# Patient Record
Sex: Male | Born: 1975 | Race: Black or African American | Hispanic: No | Smoking: Former smoker
Health system: Southern US, Community
[De-identification: ages and names within clinical notes are randomized; demographics above are authoritative.]

## PROBLEM LIST (undated history)

## (undated) DIAGNOSIS — E119 Type 2 diabetes mellitus without complications: Secondary | ICD-10-CM

## (undated) HISTORY — DX: Type 2 diabetes mellitus without complications: E11.9

---

## 1999-08-04 ENCOUNTER — Emergency Department (HOSPITAL_COMMUNITY): Admission: EM | Admit: 1999-08-04 | Discharge: 1999-08-04 | Payer: Self-pay | Admitting: Emergency Medicine

## 1999-08-05 ENCOUNTER — Encounter: Payer: Self-pay | Admitting: Emergency Medicine

## 2008-06-22 ENCOUNTER — Emergency Department (HOSPITAL_BASED_OUTPATIENT_CLINIC_OR_DEPARTMENT_OTHER): Admission: EM | Admit: 2008-06-22 | Discharge: 2008-06-22 | Payer: Self-pay | Admitting: Emergency Medicine

## 2008-06-22 ENCOUNTER — Ambulatory Visit: Payer: Self-pay | Admitting: Diagnostic Radiology

## 2009-06-21 ENCOUNTER — Emergency Department (HOSPITAL_BASED_OUTPATIENT_CLINIC_OR_DEPARTMENT_OTHER): Admission: EM | Admit: 2009-06-21 | Discharge: 2009-06-21 | Payer: Self-pay | Admitting: Emergency Medicine

## 2011-08-16 ENCOUNTER — Encounter (HOSPITAL_BASED_OUTPATIENT_CLINIC_OR_DEPARTMENT_OTHER): Payer: Self-pay | Admitting: *Deleted

## 2011-08-16 ENCOUNTER — Emergency Department (INDEPENDENT_AMBULATORY_CARE_PROVIDER_SITE_OTHER): Payer: Self-pay

## 2011-08-16 ENCOUNTER — Emergency Department (HOSPITAL_BASED_OUTPATIENT_CLINIC_OR_DEPARTMENT_OTHER)
Admission: EM | Admit: 2011-08-16 | Discharge: 2011-08-16 | Disposition: A | Discharge status: Home / Self Care | Payer: Self-pay | Attending: Emergency Medicine | Admitting: Emergency Medicine

## 2011-08-16 DIAGNOSIS — S298XXA Other specified injuries of thorax, initial encounter: Secondary | ICD-10-CM

## 2011-08-16 DIAGNOSIS — IMO0002 Reserved for concepts with insufficient information to code with codable children: Secondary | ICD-10-CM

## 2011-08-16 DIAGNOSIS — W219XXA Striking against or struck by unspecified sports equipment, initial encounter: Secondary | ICD-10-CM | POA: Insufficient documentation

## 2011-08-16 DIAGNOSIS — S20219A Contusion of unspecified front wall of thorax, initial encounter: Secondary | ICD-10-CM | POA: Insufficient documentation

## 2011-08-16 DIAGNOSIS — Y9364 Activity, baseball: Secondary | ICD-10-CM | POA: Insufficient documentation

## 2011-08-16 MED ORDER — IBUPROFEN 800 MG PO TABS: 800.0000 mg | ORAL_TABLET | Freq: Three times a day (TID) | ORAL | Status: AC

## 2011-08-16 MED ORDER — IBUPROFEN 800 MG PO TABS
800.0000 mg | ORAL_TABLET | Freq: Once | ORAL | Status: AC
  Administered 2011-08-16: 800 mg via ORAL
  Filled 2011-08-16: qty 1

## 2011-08-16 MED ORDER — OXYCODONE HCL 5 MG PO TABS: 5.0000 mg | ORAL_TABLET | ORAL | Status: AC | PRN
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
## 2011-08-16 NOTE — ED Notes (Signed)
Pt amb to room 7 with quick steady gait in nad. 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 Cont with rib pain, ice pack in place. Lungs are cta a+p x 2,  Pt denies sob, pain with inspiration.

## 2011-08-16 NOTE — Discharge Instructions (Signed)
Chest Contusion You have been checked for injuries to your chest. Your caregiver has not found injuries serious enough to require hospitalization. It is common to have bruises and sore muscles after an injury. These tend to feel worse the first 24 hours. You may gradually develop more stiffness and soreness over the next several hours to several days. This usually feels worse the first morning following your injury. After a few days, you will usually begin to improve. The amount of improvement depends on the amount of damage. Following the accident, if the pain in any area continues to increase or you develop new areas of pain, you should see your primary caregiver or return to the Emergency Department for re-evaluation. HOME CARE INSTRUCTIONS   Put ice on sore areas every 2 hours for 20 minutes while awake for the next 2 days.   Drink extra fluids. Do not drink alcohol.   Activity as tolerated. Lifting may make pain worse.   Only take over-the-counter or prescription medicines for pain, discomfort, or fever as directed by your caregiver. Do not use aspirin. This may increase bruising or increase bleeding.  SEEK IMMEDIATE MEDICAL CARE IF:   There is a worsening of any of the problems that brought you in for care.   Shortness of breath, dizziness or fainting develop.   You have chest pain, difficulty breathing, or develop pain going down the left arm or up into jaw.   You feel sick to your stomach (nausea), vomiting or sweats.   You have increasing belly (abdominal) discomfort.   There is blood in your urine, stool, or if you vomit blood.   There is pain in either shoulder in an area where a shoulder strap would be.   You have feelings of lightheadedness, or if you should have a fainting episode.   You have numbness, tingling, weakness, or problems with the use of your arms or legs.   Severe headaches not relieved with medications develop.   You have a change in bowel or bladder  control.   There is increasing pain in any areas of the body.  If you feel your symptoms are worsening, and you are not able to see your primary caregiver, return to the Emergency Department immediately. MAKE SURE YOU:   Understand these instructions.   Will watch your condition.   Will get help right away if you are not doing well or get worse.  Document Released: 01/15/2001 Document Revised: 04/11/2011 Document Reviewed: 12/09/2007 [facility] Patient Information 2012 [place], [place].

## 2011-08-21 ENCOUNTER — Encounter: Payer: Self-pay | Admitting: Internal Medicine

## 2011-08-22 ENCOUNTER — Encounter: Payer: Self-pay | Admitting: Internal Medicine

## 2011-08-23 ENCOUNTER — Ambulatory Visit: Payer: Self-pay | Admitting: Internal Medicine

## 2011-09-03 ENCOUNTER — Emergency Department (HOSPITAL_BASED_OUTPATIENT_CLINIC_OR_DEPARTMENT_OTHER)
Admission: EM | Admit: 2011-09-03 | Discharge: 2011-09-04 | Disposition: A | Discharge status: Home / Self Care | Payer: BC Managed Care – PPO | Attending: Emergency Medicine | Admitting: Emergency Medicine

## 2011-09-03 ENCOUNTER — Encounter (HOSPITAL_BASED_OUTPATIENT_CLINIC_OR_DEPARTMENT_OTHER): Payer: Self-pay | Admitting: Student

## 2011-09-03 DIAGNOSIS — L0291 Cutaneous abscess, unspecified: Secondary | ICD-10-CM

## 2011-09-03 DIAGNOSIS — IMO0002 Reserved for concepts with insufficient information to code with codable children: Secondary | ICD-10-CM | POA: Insufficient documentation

## 2011-09-03 NOTE — ED Notes (Signed)
Pt in with c/o abscess to right upper bicep

## 2011-09-03 NOTE — ED Notes (Signed)
Pt reieved new tatoo 4/20, on 4/27 developed raised area medial to tattoo,  Large boil noted to area, warm, tender to touch, surrounding skin intact, + pp,

## 2011-09-04 MED ORDER — MORPHINE SULFATE 4 MG/ML IJ SOLN
4.0000 mg | Freq: Once | INTRAMUSCULAR | Status: AC
  Administered 2011-09-04: 4 mg via INTRAMUSCULAR
  Filled 2011-09-04: qty 1

## 2011-09-04 MED ORDER — SULFAMETHOXAZOLE-TRIMETHOPRIM 800-160 MG PO TABS: 1.0000 | ORAL_TABLET | Freq: Two times a day (BID) | ORAL | Status: AC

## 2011-09-04 MED ORDER — OXYCODONE HCL 5 MG PO TABS: 5.0000 mg | ORAL_TABLET | Freq: Four times a day (QID) | ORAL | Status: AC | PRN

## 2011-09-04 MED ORDER — LIDOCAINE-EPINEPHRINE 2 %-1:100000 IJ SOLN
INTRAMUSCULAR | Status: AC
  Filled 2011-09-04: qty 1

## 2011-09-04 NOTE — ED Provider Notes (Signed)
History     CSN: [id number]  Arrival date & time 09/03/11  2116   First MD Initiated Contact with Patient 09/04/11 0049      Chief Complaint  Patient presents with  . Abscess     Patient is a 36 y.o. male presenting with abscess. The history is provided by the patient.  Abscess  This is a new problem. The current episode started less than one week ago. The onset was gradual. The problem occurs continuously. The problem has been gradually worsening. Affected Location: right upper extremity. The problem is moderate. Pertinent negatives include no fever, no vomiting and no cough.  pt reports he had tattoo placed to right UE on 4/20.  Several days later he noticed redness/pain near the tattoo He has no other significant complaints  PMH - none  History reviewed. No pertinent past surgical history.  History reviewed. No pertinent family history.  History  Substance Use Topics  . Smoking status: Never Smoker   . Smokeless tobacco: Not on file  . Alcohol Use: No      Review of Systems  Constitutional: Negative for fever.  HENT: Negative for facial swelling.   Respiratory: Negative for cough.   Gastrointestinal: Negative for vomiting.  Musculoskeletal: Negative for joint swelling.  Skin: Positive for color change and wound.  Neurological: Negative for weakness.  Psychiatric/Behavioral: Negative for agitation.    Allergies  Tylenol  Home Medications   Current Outpatient Rx  Name Route Sig Dispense Refill  . ASPIRIN-ACETAMINOPHEN-CAFFEINE 250-250-65 MG PO TABS Oral Take 2 tablets by mouth every 6 (six) hours as needed. For headache    . VITAMINS A & D EX OINT Topical Apply 1 application topically 2 (two) times daily. For tattoo    . OXYCODONE HCL 5 MG PO TABS Oral Take 1 tablet (5 mg total) by mouth every 6 (six) hours as needed for pain. 15 tablet 0  . SULFAMETHOXAZOLE-TRIMETHOPRIM 800-160 MG PO TABS Oral Take 1 tablet by mouth every 12 (twelve) hours. 10 tablet 0     BP 105/60  Pulse 58  Temp(Src) 98.9 F (37.2 C) (Oral)  Resp 20  Ht 6' (1.829 m)  Wt 170 lb (77.111 kg)  BMI 23.06 kg/m2  SpO2 100%  Physical Exam CONSTITUTIONAL: Well developed/well nourished HEAD AND FACE: Normocephalic/atraumatic EYES: EOMI/PERRL ENMT: Mucous membranes moist NECK: supple no meningeal signs SPINE:entire spine nontender CV: S1/S2 noted, no murmurs/rubs/gallops noted LUNGS: Lungs are clear to auscultation bilaterally, no apparent distress ABDOMEN: soft, nontender, no rebound or guarding GU:no cva tenderness NEURO: Pt is awake/alert, moves all extremitiesx4 EXTREMITIES: pulses normal, full ROM.  Abscess noted to right upper extremity just medial to the tattoo.  Localized erythema noted but no crepitance noted.  No streaking noted SKIN: warm, color normal PSYCH: no abnormalities of mood noted  ED Course  Procedures   INCISION AND DRAINAGE Performed by: Joya Gaskins Consent: Verbal consent obtained. Risks and benefits: risks, benefits and alternatives were discussed Type: abscess  Body area: right arm  Anesthesia: local infiltration  Local anesthetic: lidocaine 1% with epinephrine  Anesthetic total: 3 ml  Complexity: complex Blunt dissection to break up loculations  Drainage: purulent  Drainage amount: minimal    Patient tolerance: Patient tolerated the procedure well with no immediate complications.       1. Abscess       MDM  Nursing notes reviewed and considered in documentation  The patient appears reasonably screened and/or stabilized for discharge and I doubt any other  medical condition or other [facility] requiring further screening, evaluation, or treatment in the ED at this time prior to discharge.         Joya Gaskins, MD 09/04/11 [phone]

## 2011-09-04 NOTE — Discharge Instructions (Signed)

## 2013-03-08 ENCOUNTER — Emergency Department (HOSPITAL_BASED_OUTPATIENT_CLINIC_OR_DEPARTMENT_OTHER)
Admission: EM | Admit: 2013-03-08 | Discharge: 2013-03-08 | Disposition: A | Discharge status: Home / Self Care | Payer: BC Managed Care – PPO | Attending: Emergency Medicine | Admitting: Emergency Medicine

## 2013-03-08 ENCOUNTER — Encounter (HOSPITAL_BASED_OUTPATIENT_CLINIC_OR_DEPARTMENT_OTHER): Payer: Self-pay | Admitting: Emergency Medicine

## 2013-03-08 DIAGNOSIS — IMO0002 Reserved for concepts with insufficient information to code with codable children: Secondary | ICD-10-CM | POA: Insufficient documentation

## 2013-03-08 DIAGNOSIS — L0291 Cutaneous abscess, unspecified: Secondary | ICD-10-CM

## 2013-03-08 MED ORDER — OXYCODONE HCL 5 MG PO TABS: 5.0000 mg | ORAL_TABLET | ORAL | Status: DC | PRN

## 2013-03-08 MED ORDER — OXYCODONE HCL 5 MG PO TABS
5.0000 mg | ORAL_TABLET | Freq: Once | ORAL | Status: AC
  Administered 2013-03-08: 5 mg via ORAL
  Filled 2013-03-08: qty 1

## 2013-03-08 MED ORDER — SULFAMETHOXAZOLE-TMP DS 800-160 MG PO TABS: 2.0000 | ORAL_TABLET | Freq: Two times a day (BID) | ORAL | Status: DC

## 2013-03-08 NOTE — ED Provider Notes (Signed)
CSN: [id number]     Arrival date & time 03/08/13  1926 History   First MD Initiated Contact with Patient 03/08/13 2033     Chief Complaint  Patient presents with  . Abscess   (Consider location/radiation/quality/duration/timing/severity/associated sxs/prior Treatment) The history is provided by the patient.   Pt has had left axillary abscess x 2 days.  Denies fevers, chills, body aches, N/V.  Wife attempted to pop it but patient couldn't stand the pain. Has taken no medications at home.  History reviewed. No pertinent past medical history. History reviewed. No pertinent past surgical history. History reviewed. No pertinent family history. History  Substance Use Topics  . Smoking status: Never Smoker   . Smokeless tobacco: Not on file  . Alcohol Use: No    Review of Systems  Constitutional: Negative for fever and chills.  Gastrointestinal: Negative for nausea and vomiting.  Skin: Positive for color change.    Allergies  Tylenol  Home Medications   Current Outpatient Rx  Name  Route  Sig  Dispense  Refill  . aspirin-acetaminophen-caffeine (EXCEDRIN MIGRAINE) 250-250-65 MG per tablet   Oral   Take 2 tablets by mouth every 6 (six) hours as needed. For headache         . Vitamins A & D (VITAMIN A & D) ointment   Topical   Apply 1 application topically 2 (two) times daily. For tattoo          BP 122/71  Pulse 61  Temp(Src) 98.6 F (37 C) (Oral)  Resp 15  Ht 6' (1.829 m)  Wt 180 lb (81.647 kg)  BMI 24.41 kg/m2  SpO2 100% Physical Exam  Nursing note and vitals reviewed. Constitutional: He appears well-developed and well-nourished. No distress.  HENT:  Head: Normocephalic and atraumatic.  Neck: Neck supple.  Pulmonary/Chest: Effort normal.  Neurological: He is alert.  Skin: He is not diaphoretic.  Tense,/fluctuant superficial abscess with overlying erythema, tender to palpation    ED Course  Procedures (including critical care time) Labs Review Labs  Reviewed - No data to display Imaging Review No results found.  EKG Interpretation   None     INCISION AND DRAINAGE Performed by: Trixie Dredge B Consent: Verbal consent obtained. Risks and benefits: risks, benefits and alternatives were discussed Type: abscess  Body area: left axilla  Anesthesia: local infiltration  Incision was made with a scalpel.  Local anesthetic: lidocaine 2% no epinephrine  Anesthetic total: 4 ml  Complexity: complex Blunt dissection to break up loculations  Drainage: purulent  Drainage amount: moderate  Packing material: none  Patient tolerance: Patient tolerated the procedure well with no immediate complications.     MDM   1. Abscess and cellulitis     Pt with abscess with overlying localized cellulitis afebrile, nontoxic, I&D performed in ED with moderate amount purulence expressed with remaining area of induration.  D/C home with oxycodone and bactrim. Discussed  findings, treatment, home care, and follow up  with patient.  Pt given return precautions.  Pt verbalizes understanding and agrees with plan.         Trixie Dredge, PA-C 03/08/13 2329

## 2013-03-08 NOTE — ED Notes (Signed)
Pt c/o abscess to left axillary x 2 days 

## 2013-03-09 NOTE — ED Provider Notes (Signed)
  Medical screening examination/treatment/procedure(s) were performed by non-physician practitioner and as supervising physician I was immediately available for consultation/collaboration.  EKG Interpretation   None          Chaun Uemura, MD 03/09/13 0003 

## 2013-03-16 ENCOUNTER — Emergency Department (HOSPITAL_BASED_OUTPATIENT_CLINIC_OR_DEPARTMENT_OTHER): Payer: BC Managed Care – PPO

## 2013-03-16 ENCOUNTER — Emergency Department (HOSPITAL_BASED_OUTPATIENT_CLINIC_OR_DEPARTMENT_OTHER)
Admission: EM | Admit: 2013-03-16 | Discharge: 2013-03-17 | Disposition: A | Discharge status: Home / Self Care | Payer: BC Managed Care – PPO | Attending: Emergency Medicine | Admitting: Emergency Medicine

## 2013-03-16 ENCOUNTER — Encounter (HOSPITAL_BASED_OUTPATIENT_CLINIC_OR_DEPARTMENT_OTHER): Payer: Self-pay | Admitting: Emergency Medicine

## 2013-03-16 DIAGNOSIS — Y92410 Unspecified street and highway as the place of occurrence of the external cause: Secondary | ICD-10-CM | POA: Insufficient documentation

## 2013-03-16 DIAGNOSIS — Z79899 Other long term (current) drug therapy: Secondary | ICD-10-CM | POA: Insufficient documentation

## 2013-03-16 DIAGNOSIS — S8990XA Unspecified injury of unspecified lower leg, initial encounter: Secondary | ICD-10-CM | POA: Insufficient documentation

## 2013-03-16 DIAGNOSIS — Y9389 Activity, other specified: Secondary | ICD-10-CM | POA: Insufficient documentation

## 2013-03-16 DIAGNOSIS — S62233A Other displaced fracture of base of first metacarpal bone, unspecified hand, initial encounter for closed fracture: Secondary | ICD-10-CM | POA: Insufficient documentation

## 2013-03-16 DIAGNOSIS — S0993XA Unspecified injury of face, initial encounter: Secondary | ICD-10-CM | POA: Insufficient documentation

## 2013-03-16 DIAGNOSIS — S62309A Unspecified fracture of unspecified metacarpal bone, initial encounter for closed fracture: Secondary | ICD-10-CM

## 2013-03-16 MED ORDER — NAPROXEN 500 MG PO TABS: 500.0000 mg | ORAL_TABLET | Freq: Two times a day (BID) | ORAL | Status: DC

## 2013-03-16 MED ORDER — MORPHINE SULFATE 4 MG/ML IJ SOLN
4.0000 mg | Freq: Once | INTRAMUSCULAR | Status: AC
  Administered 2013-03-16: 4 mg via INTRAVENOUS
  Filled 2013-03-16 (×2): qty 1

## 2013-03-16 MED ORDER — OXYCODONE HCL 5 MG PO TABS: 5.0000 mg | ORAL_TABLET | ORAL | Status: DC | PRN

## 2013-03-16 MED ORDER — SODIUM CHLORIDE 0.9 % IV SOLN
INTRAVENOUS | Status: DC
  Administered 2013-03-16: 22:00:00 via INTRAVENOUS

## 2013-03-16 MED ORDER — IOHEXOL 300 MG/ML  SOLN
100.0000 mL | Freq: Once | INTRAMUSCULAR | Status: AC | PRN
  Administered 2013-03-16: 100 mL via INTRAVENOUS
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
## 2013-03-16 NOTE — ED Notes (Signed)
First Thumb spica splint from Rt arm removed due to Pt stating being to tight. Thumb spica splint was completely redone over and Pt states it no longer feels tight.

## 2014-09-15 ENCOUNTER — Other Ambulatory Visit: Payer: Self-pay | Admitting: Adult Health

## 2014-09-15 ENCOUNTER — Ambulatory Visit (INDEPENDENT_AMBULATORY_CARE_PROVIDER_SITE_OTHER): Payer: Self-pay

## 2014-09-15 DIAGNOSIS — M79642 Pain in left hand: Secondary | ICD-10-CM

## 2014-09-15 DIAGNOSIS — R52 Pain, unspecified: Secondary | ICD-10-CM

## 2015-03-08 ENCOUNTER — Encounter: Payer: Self-pay | Admitting: *Deleted

## 2015-03-08 ENCOUNTER — Emergency Department (INDEPENDENT_AMBULATORY_CARE_PROVIDER_SITE_OTHER)
Admission: EM | Admit: 2015-03-08 | Discharge: 2015-03-08 | Disposition: A | Discharge status: Home / Self Care | Payer: PRIVATE HEALTH INSURANCE | Source: Home / Self Care | Attending: Family Medicine | Admitting: Family Medicine

## 2015-03-08 DIAGNOSIS — S7012XA Contusion of left thigh, initial encounter: Secondary | ICD-10-CM

## 2015-03-08 MED ORDER — MELOXICAM 15 MG PO TABS: 15.0000 mg | ORAL_TABLET | Freq: Every day | ORAL | Status: DC

## 2015-03-08 MED ORDER — CYCLOBENZAPRINE HCL 5 MG PO TABS: ORAL_TABLET | ORAL | Status: DC
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
## 2015-03-08 NOTE — ED Provider Notes (Signed)
CSN: [id number]     Arrival date & time 03/08/15  1615 History   First MD Initiated Contact with Patient 03/08/15 1641     Chief Complaint  Patient presents with  . Leg Pain      HPI Comments: While playing basketball one week ago, patient was "kneed" on his left anterior/lateral thigh by another player.  His initial swelling has decreased, but he still has pain in his left anterior thigh when walking/running.  Patient is a 39 y.o. male presenting with leg pain. The history is provided by the patient.  Leg Pain Location:  Leg Time since incident:  2 weeks Injury: yes   Mechanism of injury comment:  Contusion while playing basketball Leg location:  L upper leg Pain details:    Quality:  Aching   Radiates to:  Does not radiate   Severity:  Mild   Onset quality:  Gradual   Duration:  1 week   Timing:  Constant   Progression:  Improving Chronicity:  New Prior injury to area:  No Worsened by:  Bearing weight and flexion Associated symptoms: decreased ROM, stiffness and swelling   Associated symptoms: no back pain, no fever, no muscle weakness, no numbness and no tingling     History reviewed. No pertinent past medical history. History reviewed. No pertinent past surgical history. Family History  Problem Relation Age of Onset  . Cancer Mother     lung CA   Social History  Substance Use Topics  . Smoking status: Former Smoker    Quit date: 03/07/2010  . Smokeless tobacco: None  . Alcohol Use: No    Review of Systems  Constitutional: Negative for fever.  Musculoskeletal: Positive for stiffness. Negative for back pain.  All other systems reviewed and are negative.   Allergies  Tylenol  Home Medications   Prior to Admission medications   Medication Sig Start Date End Date Taking? Authorizing Provider  cyclobenzaprine (FLEXERIL) 5 MG tablet Take one tab by mouth at bedtime for muscle spasm 03/08/15   Lattie HawStephen A Beese, MD  meloxicam (MOBIC) 15 MG tablet Take 1 tablet (15  mg total) by mouth daily. Take with food each morning 03/08/15   Lattie HawStephen A Beese, MD   Meds Ordered and Administered this Visit  Medications - No data to display  BP 111/68 mmHg  Pulse 52  Resp 14  Ht 6' (1.829 m)  Wt 168 lb (76.204 kg)  BMI 22.78 kg/m2  SpO2 100% No data found.   Physical Exam  Constitutional: He is oriented to person, place, and time. He appears well-developed and well-nourished. No distress.  HENT:  Head: Normocephalic.  Eyes: Pupils are equal, round, and reactive to light.  Neck: Normal range of motion.  Pulmonary/Chest: Breath sounds normal.  Abdominal: There is no tenderness.  Musculoskeletal:       Left upper leg: He exhibits tenderness. He exhibits no bony tenderness, no swelling, no edema, no deformity and no laceration.       Legs: Left anterior thigh has tenderness to palpation extending to the greater trochanter.  Knee and hip have relatively good range of motion.  No swelling noted.  No erythema or warmth.  Distal neurovascular function is intact.   Neurological: He is alert and oriented to person, place, and time.  Skin: Skin is warm and dry.  Nursing note and vitals reviewed.   ED Course  Procedures  none   MDM   1. Contusion of left thigh, initial encounter  Begin Mobic  QAM, and Flexeril  HS Ace wrap applied. Apply ice pack for 20 to 30 minutes, 2 to 3 times daily  Continue until pain decreases.  Then may switch to heating pad.  Wear ace wrap daytime.  Begin stretching and range of motion exercises as tolerated. Followup with Dr. Rodney Langton or Dr. Clementeen [facility] ([facility]) if not improving about two weeks.     Lattie Haw, MD 03/09/15 1137

## 2015-03-08 NOTE — Discharge Instructions (Signed)
Apply ice pack for 20 to 30 minutes, 2 to 3 times daily  Continue until pain decreases.  Then may switch to heating pad.  Wear ace wrap daytime.  Begin stretching and range of motion exercises as tolerated.   Quadriceps Contusion With Rehab A contusion is a bruise to the skin and underlying tissues. The muscles on the top of the thigh (quadriceps) are a common location for a contusion. The contusion is caused by trauma that results in bleeding that enters the muscles, tendons, and skin.  SYMPTOMS   Pain, inflammation, and tenderness over the quadriceps muscle.  Characteristic "black and blue" discoloration.  Feeling of firmness when pressure is exerted at the injury site.  Limited function of the quadriceps muscles (straightening the knee or bending the hip).  Knee stiffness or pain when trying to bend the knee. CAUSES  Quadriceps contusions are caused by direct trauma to the thigh the ruptures small blood vessels, allowing blood to enter the soft tissues. RISK INCREASES WITH:  Contact sports (football, rugby, or soccer).  Failure to wear protective equipment.  Bleeding disorder or use of blood thinners (anticoagulants), or nonsteroidal anti-inflammatory medications. PREVENTION   Wear properly fitted and padded protective equipment.  If you have a bleeding disorder, then avoid any activities that may result in a traumatic injury.  Limit use of anticoagulants and nonsteroidal anti-inflammatory medications. PROGNOSIS  If treated properly, then quadriceps contusions usually heal within 2 weeks.  RELATED COMPLICATIONS   Complications from excessive bleeding, especially compartment syndrome.  Bone formation (calcification) that occurs during healing and may result in pain or limited function.  Infection (uncommon).  Knee stiffness or loss of motion.  Prolonged healing time, if improperly treated or re-injured. TREATMENT  Treatment initially involves the use of ice and  medication to help reduce pain and inflammation. Do not take nonsteroidal anti-inflammatory medications within 3 days of injury. The use of strengthening and stretching exercises may help reduce pain with activity. These exercises may be performed at home or with referral to a therapist. Compression bandages may also help reduce pain and inflammation. Rarely, your caregiver may use a needle to remove the collection of blood under the skin (hematoma).  MEDICATION If pain medication is necessary, then nonsteroidal anti-inflammatory medications, such as aspirin and ibuprofen, or other minor pain relievers, such as acetaminophen, are often recommended.  HEAT AND COLD  Cold treatment (icing) relieves pain and reduces inflammation. Cold treatment should be applied for 10 to 15 minutes every 2 to 3 hours for inflammation and pain and immediately after any activity that aggravates your symptoms. Use ice packs or massage the area with a piece of ice (ice massage).  Heat treatment may be used prior to performing the stretching and strengthening activities prescribed by your caregiver, physical therapist, or athletic trainer. Use a heat pack or soak the injury in warm water. SEEK MEDICAL CARE IF:  Treatment seems to offer no benefit, or the condition worsens.  Any medications produce adverse side effects. EXERCISES  RANGE OF MOTION (ROM) AND STRETCHING EXERCISES - Quadriceps Contusion These exercises may help you when beginning to rehabilitate your injury. Your symptoms may resolve with or without further involvement from your physician, physical therapist or athletic trainer. While completing these exercises, remember:   Restoring tissue flexibility helps normal motion to return to the joints. This allows healthier, less painful movement and activity.  An effective stretch should be held for at least 30 seconds.  A stretch should never be painful. You  should only feel a gentle lengthening or release in  the stretched tissue. RANGE OF MOTION - Knee Flexion, Active  Lie on your back with both knees straight. (If this causes back discomfort, bend your opposite knee, placing your foot flat on the floor.)  Slowly slide your heel back toward your buttocks until you feel a gentle stretch in the front of your knee or thigh.  Hold for ____10______ seconds. Slowly slide your heel back to the starting position. Repeat ____10______ times. Complete this exercise ___2_______ times per day.  STRETCH - Quadriceps, Prone   Lie on your stomach on a firm surface, such as a bed or padded floor.  Bend your right / left knee and grasp your ankle. If you are unable to reach, your ankle or pant leg, use a belt around your foot to lengthen your reach.  Gently pull your heel toward your buttocks. Your knee should not slide out to the side. You should feel a stretch in the front of your thigh and/or knee.  Hold this position for ____10______ seconds. Repeat _____10_____ times. Complete this stretch ______2____ times per day.  STRENGTHENING EXERCISES - Quadriceps Contusion These exercises may help you when beginning to rehabilitate your injury. They may resolve your symptoms with or without further involvement from your physician, physical therapist or athletic trainer. While completing these exercises, remember:   Muscles can gain both the endurance and the strength needed for everyday activities through controlled exercises.  Complete these exercises as instructed by your physician, physical therapist or athletic trainer. Progress the resistance and repetitions only as guided. STRENGTH - Quadriceps, Isometrics  Lie on your back with your right / left leg extended and your opposite knee bent.  Gradually tense the muscles in the front of your right / left thigh. You should see either your knee cap slide up toward your hip or increased dimpling just above the knee. This motion will push the back of the knee down  toward the floor/mat/bed on which you are lying.  Hold the muscle as tight as you can without increasing your pain for ___10_______ seconds.  Relax the muscles slowly and completely in between each repetition. Repeat _____10_____ times. Complete this exercise _____2_____ times per day.  STRENGTH - Quadriceps, Short Arcs   Lie on your back. Place a ___6_______ inch towel roll under your knee so that the knee slightly bends.  Raise only your lower leg by tightening the muscles in the front of your thigh. Do not allow your thigh to rise.  Hold this position for ___20_______ seconds. Repeat ______5____ times. Complete this exercise _____2_____ times per day.  OPTIONAL ANKLE WEIGHTS: Begin with _________2___________, but DO NOT exceed _________5___________. Increase in 1 lb/0.5 kg increments.  STRENGTH - Quadriceps, Straight Leg Raises  Quality counts! Watch for signs that the quadriceps muscle is working to insure you are strengthening the correct muscles and not "cheating" by substituting with healthier muscles.  Lay on your back with your right / left leg extended and your opposite knee bent.  Tense the muscles in the front of your right / left thigh. You should see either your knee cap slide up or increased dimpling just above the knee. Your thigh may even quiver.  Tighten these muscles even more and raise your leg 4 to 6 inches off the floor. Hold for ______10____ seconds.  Keeping these muscles tense, lower your leg.  Relax the muscles slowly and completely in between each repetition. Repeat ______5____ times. Complete this exercise  _____1_____ times per day.  STRENGTH - Quadriceps, Wall Slides  Follow guidelines for form closely. Increased knee pain often results from poorly placed feet or knees.  Lean against a smooth wall or door and walk your feet out 18-24 inches. Place your feet hip-width apart.  Slowly slide down the wall or door until your knees bend ____45______ degrees.*  Keep your knees over your heels, not your toes, and in line with your hips, not falling to either side.  Hold for _____10_____ seconds. Stand up to rest for ____10______ seconds in between each repetition. Repeat _____5_____ times. Complete this exercise ____1______ times per day. * Your physician, physical therapist or athletic trainer will alter this angle based on your symptoms and progress.   This information is not intended to replace advice given to you by your health care provider. Make sure you discuss any questions you have with your health care provider.   Document Released: 04/22/2005 Document Revised: 09/06/2014 Document Reviewed: 08/04/2008 Elsevier Interactive Patient Education [employer]

## 2015-08-30 ENCOUNTER — Encounter: Payer: Self-pay | Admitting: *Deleted

## 2015-08-30 ENCOUNTER — Emergency Department (INDEPENDENT_AMBULATORY_CARE_PROVIDER_SITE_OTHER)
Admission: EM | Admit: 2015-08-30 | Discharge: 2015-08-30 | Disposition: A | Discharge status: Home / Self Care | Payer: Self-pay | Source: Home / Self Care | Attending: Family Medicine | Admitting: Family Medicine

## 2015-08-30 DIAGNOSIS — S39012D Strain of muscle, fascia and tendon of lower back, subsequent encounter: Secondary | ICD-10-CM

## 2015-08-30 MED ORDER — KETOROLAC TROMETHAMINE 60 MG/2ML IM SOLN
60.0000 mg | Freq: Once | INTRAMUSCULAR | Status: AC
  Administered 2015-08-30: 60 mg via INTRAMUSCULAR

## 2015-08-30 MED ORDER — PREDNISONE 20 MG PO TABS: ORAL_TABLET | ORAL | Status: DC

## 2015-08-30 MED ORDER — TRAMADOL HCL 50 MG PO TABS: 50.0000 mg | ORAL_TABLET | Freq: Four times a day (QID) | ORAL | Status: DC | PRN

## 2015-08-30 MED ORDER — METHYLPREDNISOLONE SODIUM SUCC 40 MG IJ SOLR
80.0000 mg | Freq: Once | INTRAMUSCULAR | Status: AC
  Administered 2015-08-30: 80 mg via INTRAMUSCULAR

## 2015-08-30 NOTE — ED Provider Notes (Signed)
CSN: [id number]     Arrival date & time 08/30/15  [id number] History   First MD Initiated Contact with Patient 08/30/15 1045     Chief Complaint  Patient presents with  . Back Pain   (Consider location/radiation/quality/duration/timing/severity/associated sxs/prior Treatment) HPI  The pt is a 40yo [facility] with a work-related back injury that initially started on 08/18/15.  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  He saw employee health for the initial injury to his knee but back pain was only mild at that time.  Pt was cleared on 08/28/15 to return to light duty but notes yesterday and today his back pain worsened significantly.  Pain is aching and sharp, worse with any movement. His back hurt so bad last night he couldn't take his prescribed flexeril. He has not taken any medication, including his prescribed meloxicam today. Denies new injuries including heavy lifting or falls.  Denies numbness or tingling in arms or legs. Denies change in bowel or bladder habits. Denies hx of back surgeries.    History reviewed. No pertinent past medical history. History reviewed. No pertinent past surgical history. Family History  Problem Relation Age of Onset  . Cancer Mother     lung CA  . Multiple sclerosis Mother   . Hypertension Father    Social History  Substance Use Topics  . Smoking status: Former Smoker    Quit date: 03/07/2010  . Smokeless tobacco: None  . Alcohol Use: No    Review of Systems  Constitutional: Negative for fever and chills.  Genitourinary: Negative for dysuria, frequency, hematuria and flank pain.  Musculoskeletal: Positive for myalgias, back pain and gait problem. Negative for arthralgias.       Bilateral lower back pain  Skin: Negative for color change, rash and wound.  Neurological: Negative for weakness and numbness.    Allergies   Tylenol  Home Medications   Prior to Admission medications   Medication Sig Start Date End Date Taking? Authorizing Provider  cyclobenzaprine (FLEXERIL) 5 MG tablet Take one tab by mouth at bedtime for muscle spasm 03/08/15   Lattie HawStephen A Beese, MD  meloxicam (MOBIC) 15 MG tablet Take 1 tablet (15 mg total) by mouth daily. Take with food each morning 03/08/15   Lattie HawStephen A Beese, MD  predniSONE (DELTASONE) 20 MG tablet 3 tabs po daily x 3 days, then 2 tabs x 3 days, then 1.5 tabs x 3 days, then 1 tab x 3 days, then 0.5 tabs x 3 days 08/30/15   Junius FinnerErin O'Malley, PA-C  traMADol (ULTRAM) 50 MG tablet Take 1 tablet (50 mg total) by mouth every 6 (six) hours as needed. 08/30/15   Junius FinnerErin O'Malley, PA-C   Meds Ordered and Administered this Visit   Medications  ketorolac (TORADOL) injection 60 mg (60 mg Intramuscular Given 08/30/15 1105)  methylPREDNISolone sodium succinate (SOLU-MEDROL) 40 mg/mL injection 80 mg (80 mg Intramuscular Given 08/30/15 1105)    BP 106/73 mmHg  Pulse 78  Temp(Src) 98.4 F (36.9 C) (Oral)  Resp 16  Ht 6' (1.829 m)  Wt 178 lb (80.74 kg)  BMI 24.14 kg/m2  SpO2 99% No data found.   Physical Exam  Constitutional: He appears well-developed and well-nourished.  HENT:  Head: Normocephalic and atraumatic.  Eyes: Conjunctivae are normal. No scleral icterus.  Neck: Normal range of motion.  Cardiovascular: Normal  rate, regular rhythm and normal heart sounds.   Pulmonary/Chest: Effort normal and breath sounds normal. No respiratory distress. He has no wheezes. He has no rales.  Abdominal: Soft. He exhibits no distension. There is no tenderness.  Musculoskeletal: Normal range of motion. He exhibits tenderness. He exhibits no edema.  No midline spinal tenderness. Tenderness to Left and Right lower lumbar muscles. Negative straight leg raise. Full ROM upper and lower extremities bilaterally with 5/5 strength.  Pt does move with slow movements when changing positions from sitting to  standing, standing to sitting, and sitting to lying.   Neurological: He is alert.  Reflex Scores:      Patellar reflexes are 2+ on the right side and 2+ on the left side. Antalgic gait  Skin: Skin is warm and dry. No rash noted. No erythema.  Nursing note and vitals reviewed.   ED Course  Procedures (including critical care time)  Labs Review Labs Reviewed - No data to display  Imaging Review No results found.    MDM   1. Low back strain, subsequent encounter    Pt c/o lower back pain from a work-related injury on 08/18/15.  No red flag symptoms. No skin changes.  Pain likely due to back muscle strain.  Tx in UC: Toradol  IM and solumedrol  IM  Pt currently prescribed meloxicam and flexeril. He may continue to take that as prescribed. Will also prescribe predisone 2 week taper and tramadol.  Home care instructions with gentle stretching exercises provided. Pt may return to work with restricted duty today. No driving, limit standing, walking, and sitting. No lifting more than 5 pounds. Encouraged to return to employee health tomorrow to discuss physical therapy or other referrals as pt was just cleared by employee health for same reported injury.    Junius Finner, PA-C 08/30/15 1232

## 2015-08-30 NOTE — ED Notes (Signed)
Pt c/o low back pain x 08/18/15. He was seen in employer Health for a workers comp LT knee injury on 08/18/15 with Tristan SchroederKaty Bess,NP with minimal back pain at that time. He reports worsening back pain x 1 wk. He was seen for f/u with Beverlyn RouxBess, NP on 08/28/15 and was released back to work.

## 2015-08-30 NOTE — Discharge Instructions (Signed)
Tramadol is strong pain medication. While taking, do not drink alcohol, drive, or perform any other activities that requires focus while taking these medications.   You were given a shot of solumedrol a steroid) today to help with itching and swelling from a likely allergic reaction.  You have been prescribed 2 weeks of prednisone, an oral steroid.  You may start this medication tomorrow with breakfast and be sure to take as directed.  If you start to have any unwanted symptoms, please be sure to talk with employee health before stopping the medication as stopping to soon can cause unwanted side effects as well.

## 2015-08-31 ENCOUNTER — Ambulatory Visit (INDEPENDENT_AMBULATORY_CARE_PROVIDER_SITE_OTHER): Payer: Self-pay

## 2015-08-31 ENCOUNTER — Other Ambulatory Visit: Payer: Self-pay | Admitting: Emergency Medicine

## 2015-08-31 DIAGNOSIS — M545 Low back pain: Secondary | ICD-10-CM

## 2016-05-14 ENCOUNTER — Emergency Department (HOSPITAL_BASED_OUTPATIENT_CLINIC_OR_DEPARTMENT_OTHER)
Admission: EM | Admit: 2016-05-14 | Discharge: 2016-05-15 | Disposition: A | Discharge status: Home / Self Care | Payer: PRIVATE HEALTH INSURANCE | Attending: Emergency Medicine | Admitting: Emergency Medicine

## 2016-05-14 ENCOUNTER — Emergency Department (HOSPITAL_BASED_OUTPATIENT_CLINIC_OR_DEPARTMENT_OTHER): Payer: PRIVATE HEALTH INSURANCE

## 2016-05-14 ENCOUNTER — Encounter (HOSPITAL_BASED_OUTPATIENT_CLINIC_OR_DEPARTMENT_OTHER): Payer: Self-pay | Admitting: *Deleted

## 2016-05-14 DIAGNOSIS — R05 Cough: Secondary | ICD-10-CM | POA: Diagnosis not present

## 2016-05-14 DIAGNOSIS — M791 Myalgia: Secondary | ICD-10-CM | POA: Insufficient documentation

## 2016-05-14 DIAGNOSIS — R509 Fever, unspecified: Secondary | ICD-10-CM | POA: Diagnosis not present

## 2016-05-14 DIAGNOSIS — Z87891 Personal history of nicotine dependence: Secondary | ICD-10-CM | POA: Diagnosis not present

## 2016-05-14 DIAGNOSIS — J111 Influenza due to unidentified influenza virus with other respiratory manifestations: Secondary | ICD-10-CM

## 2016-05-14 DIAGNOSIS — R69 Illness, unspecified: Secondary | ICD-10-CM

## 2016-05-14 MED ORDER — IBUPROFEN 800 MG PO TABS
ORAL_TABLET | ORAL | Status: AC
  Filled 2016-05-14: qty 1

## 2016-05-14 MED ORDER — IBUPROFEN 800 MG PO TABS
800.0000 mg | ORAL_TABLET | Freq: Once | ORAL | Status: AC
  Administered 2016-05-14: 800 mg via ORAL

## 2016-05-14 NOTE — ED Notes (Signed)
ED Provider at bedside. 

## 2016-05-14 NOTE — ED Triage Notes (Signed)
Pt c/o URi symptoms with body aches and fever x 6 days

## 2016-05-14 NOTE — ED Provider Notes (Signed)
[facility]   CSN: [id number] Arrival date & time: 05/14/16  2159  By signing my name below, I, Linna DarnerRussell Turner, attest that this documentation has been prepared under the direction and in the presence of physician practitioner, Tilden FossaElizabeth Betzalel Umbarger, MD. Electronically Signed: Linna Darnerussell Turner, Scribe. 05/14/2016. 11:34 PM.  History   Chief Complaint Chief Complaint  Patient presents with  . URI    The history is provided by the patient. No language interpreter was used.     HPI Comments: Henry Ramsey is a 41 y.o. male who presents to the Emergency Department complaining of constant generalized myalgias for 2-3 days. He reports an associated subjective fever as well as a productive cough with dark mucus. He notes one episode of hemoptysis since onset. He has tried generic Dayquil/Nyquil with no improvement of his symptoms. No known sick contacts with similar symptoms. No known medical problems. No regular medications. He notes an allergy to Tylenol (rash). He denies congestion, sore throat, nausea, vomiting, diarrhea, or any other associated symptoms.  History reviewed. No pertinent past medical history.  There are no active problems to display for this patient.   History reviewed. No pertinent surgical history.     Home Medications    Prior to Admission medications   Medication Sig Start Date End Date Taking? Authorizing Provider  traMADol (ULTRAM) 50 MG tablet Take 1 tablet (50 mg total) by mouth every 6 (six) hours as needed. 08/30/15   Junius FinnerErin O'Malley, PA-C    Family History Family History  Problem Relation Age of Onset  . Cancer Mother     lung CA  . Multiple sclerosis Mother   . Hypertension Father     Social History Social History  Substance Use Topics  . Smoking status: Former Smoker    Quit date: 03/07/2010  . Smokeless tobacco: Not on file  . Alcohol use No     Allergies   Tylenol [acetaminophen]   Review of Systems Review of Systems    Constitutional: Positive for fever (subjective).  HENT: Negative for congestion and sore throat.   Respiratory: Positive for cough (productive; one episode of hemoptysis).   Gastrointestinal: Negative for diarrhea, nausea and vomiting.  Musculoskeletal: Positive for myalgias (generalized).  All other systems reviewed and are negative.    Physical Exam Updated Vital Signs BP 116/84   Pulse 61   Temp 99.1 F (37.3 C) (Oral)   Resp 18   Ht 6' (1.829 m)   Wt 170 lb (77.1 kg)   SpO2 98%   BMI 23.06 kg/m   Physical Exam  Constitutional: He is oriented to person, place, and time. He appears well-developed and well-nourished.  HENT:  Head: Normocephalic and atraumatic.  Cardiovascular: Normal rate and regular rhythm.   No murmur heard. Pulmonary/Chest: Effort normal and breath sounds normal. No respiratory distress.  Abdominal: Soft. There is no tenderness. There is no rebound and no guarding.  Musculoskeletal: He exhibits no edema or tenderness.  Neurological: He is alert and oriented to person, place, and time.  Skin: Skin is warm and dry.  Psychiatric: He has a normal mood and affect. His behavior is normal.  Nursing note and vitals reviewed.    ED Treatments / Results  Labs (all labs ordered are listed, but only abnormal results are displayed) Labs Reviewed - No data to display  EKG  EKG Interpretation None       Radiology Dg Chest 2 View  Result Date: 05/14/2016 CLINICAL DATA:  Fever  cough and body aches EXAM: CHEST  2 VIEW COMPARISON:  08/16/2011 FINDINGS: The heart size and mediastinal contours are within normal limits. Both lungs are clear. The visualized skeletal structures are unremarkable. IMPRESSION: No active cardiopulmonary disease. Electronically Signed   By: Jasmine Pang M.D.   On: 05/14/2016 23:54    Procedures Procedures (including critical care time)  DIAGNOSTIC STUDIES: Oxygen Saturation is 98% on RA, normal by my interpretation.     COORDINATION OF CARE: 11:38 PM Discussed treatment plan with pt at bedside and pt agreed to plan.  Medications Ordered in ED Medications  ibuprofen (ADVIL,MOTRIN) tablet 800 mg (800 mg Oral Given 05/14/16 2214)     Initial Impression / Assessment and Plan / ED Course  I have reviewed the triage vital signs and the nursing notes.  Pertinent labs & imaging results that were available during my care of the patient were reviewed by me and considered in my medical decision making (see chart for details).  Clinical Course     Patient here for evaluation of fever, cough, body aches. He is nontoxic appearing on examination with no respiratory distress. Presentation is not consistent with PE, pulmonary hemorrhage, serious bacterial infection. Discussed likely influenza, patient is out of the window for Tamiflu treatment. Discussed home care with Tylenol or ibuprofen for fever and myalgias. Discussed oral fluid hydration. Discussed outpatient follow-up and return precautions.  Final Clinical Impressions(s) / ED Diagnoses   Final diagnoses:  Influenza-like illness    New Prescriptions Discharge Medication List as of 05/15/2016 12:07 AM     I personally performed the services described in this documentation, which was scribed in my presence. The recorded information has been reviewed and is accurate.    Tilden Fossa, MD 05/15/16 [phone]

## 2016-06-24 ENCOUNTER — Encounter (HOSPITAL_BASED_OUTPATIENT_CLINIC_OR_DEPARTMENT_OTHER): Payer: Self-pay | Admitting: *Deleted

## 2016-06-24 ENCOUNTER — Emergency Department (HOSPITAL_BASED_OUTPATIENT_CLINIC_OR_DEPARTMENT_OTHER)
Admission: EM | Admit: 2016-06-24 | Discharge: 2016-06-24 | Disposition: A | Discharge status: Home / Self Care | Payer: PRIVATE HEALTH INSURANCE | Attending: Emergency Medicine | Admitting: Emergency Medicine

## 2016-06-24 DIAGNOSIS — M549 Dorsalgia, unspecified: Secondary | ICD-10-CM | POA: Diagnosis not present

## 2016-06-24 DIAGNOSIS — J029 Acute pharyngitis, unspecified: Secondary | ICD-10-CM

## 2016-06-24 DIAGNOSIS — Z87891 Personal history of nicotine dependence: Secondary | ICD-10-CM | POA: Diagnosis not present

## 2016-06-24 LAB — RAPID STREP SCREEN (MED CTR MEBANE ONLY): Streptococcus, Group A Screen (Direct): NEGATIVE

## 2016-06-24 MED ORDER — AMOXICILLIN 500 MG PO CAPS: 500.0000 mg | ORAL_CAPSULE | Freq: Three times a day (TID) | ORAL | 0 refills | Status: DC

## 2016-06-24 NOTE — Discharge Instructions (Signed)
Get help right away if: °Your neck becomes stiff. °You drool or are unable to swallow liquids. °You vomit or are unable to keep medicines or liquids down. °You have severe pain that does not go away with the use of recommended medicines. °You have trouble breathing (not caused by a stuffy nose). °

## 2016-06-24 NOTE — ED Triage Notes (Signed)
Sore throat x 2 days

## 2016-06-24 NOTE — ED Provider Notes (Signed)
[facility]   CSN: [id number] Arrival date & time: 06/24/16  1713  By signing my name below, I, Arianna Nassar, attest that this documentation has been prepared under the direction and in the presence of Arthor Captain,  PA-C.  Electronically Signed: Octavia Heir, ED Scribe. 06/24/16. 6:18 PM.    History   Chief Complaint Chief Complaint  Patient presents with  . Sore Throat   The history is provided by the patient. No language interpreter was used.   HPI Comments: Henry Ramsey is a 41 y.o. male who presents to the Emergency Department complaining of moderate, gradual worsening sore throat x 4 days. He states his sore is sore and scratchy. He reports associated fever (tmax 100) that has since resolved. Pt reports having the flu ~ 3 weeks ago. He says that his symptoms started to alleviate significantly but about 4 days ago his sore throat came back. He is able to swallow his own secretions. Pt denies ear pain, generalized myalgias, and trouble swallowing.   History reviewed. No pertinent past medical history.  There are no active problems to display for this patient.   History reviewed. No pertinent surgical history.     Home Medications    Prior to Admission medications   Not on File    Family History Family History  Problem Relation Age of Onset  . Cancer Mother     lung CA  . Multiple sclerosis Mother   . Hypertension Father     Social History Social History  Substance Use Topics  . Smoking status: Former Smoker    Quit date: 03/07/2010  . Smokeless tobacco: Never Used  . Alcohol use No     Allergies   Tylenol [acetaminophen]   Review of Systems Review of Systems  HENT: Positive for sore throat. Negative for ear pain and trouble swallowing.   Musculoskeletal: Positive for back pain. Negative for arthralgias.     Physical Exam Updated Vital Signs BP 116/68 (BP Location: Left Arm)   Pulse 93   Temp 97.9 F (36.6 C)  (Oral)   Resp 16   Ht 6' (1.829 m)   Wt 176 lb (79.8 kg)   SpO2 100%   BMI 23.87 kg/m   Physical Exam  Constitutional: He is oriented to person, place, and time. He appears well-developed and well-nourished.  HENT:  Head: Normocephalic.  Mouth/Throat: Posterior oropharyngeal erythema present.  Bilateral tonsillar erythema   Eyes: EOM are normal.  Neck: Normal range of motion.  Cardiovascular: Normal rate and regular rhythm.   Pulmonary/Chest: Effort normal and breath sounds normal.  Abdominal: He exhibits no distension.  Musculoskeletal: Normal range of motion.  Neurological: He is alert and oriented to person, place, and time.  Psychiatric: He has a normal mood and affect.  Nursing note and vitals reviewed.    ED Treatments / Results  DIAGNOSTIC STUDIES: Oxygen Saturation is 100% on RA, normal by my interpretation.  COORDINATION OF CARE:  6:14 PM Discussed treatment plan with pt at bedside and pt agreed to plan.  Labs (all labs ordered are listed, but only abnormal results are displayed) Labs Reviewed  RAPID STREP SCREEN (NOT AT [facility])  CULTURE, GROUP A STREP [facility])    EKG  EKG Interpretation None       Radiology No results found.  Procedures Procedures (including critical care time)  Medications Ordered in ED Medications - No data to display   Initial Impression / Assessment and Plan / ED Course  I have reviewed the triage vital signs and the nursing notes.  Pertinent labs & imaging results that were available during my care of the patient were reviewed by me and considered in my medical decision making (see chart for details).     =  Final Clinical Impressions(s) / ED Diagnoses   Final diagnoses:  None   Patient with recent viral infection and now secondarily presents with body aches, chills and pharyngitis. Given this history, I have concern for secondary bacterial infection. Patient will be discharged with amoxicillin. Despite negative rapid  strep test. I discussed supportive care, OTC medications agrees to seek immediate medical care. Patient safe for discharge at this time.  I personally performed the services described in this documentation, which was scribed in my presence. The recorded information has been reviewed and is accurate.    New Prescriptions New Prescriptions   No medications on file     Arthor Captainbigail Joshue Badal, PA-C 06/25/16 0057    Loren Raceravid Yelverton, MD 06/25/16 [phone]

## 2016-06-27 LAB — CULTURE, GROUP A STREP (THRC)

## 2016-10-17 ENCOUNTER — Emergency Department (HOSPITAL_BASED_OUTPATIENT_CLINIC_OR_DEPARTMENT_OTHER)
Admission: EM | Admit: 2016-10-17 | Discharge: 2016-10-17 | Disposition: A | Discharge status: Home / Self Care | Payer: PRIVATE HEALTH INSURANCE | Attending: Emergency Medicine | Admitting: Emergency Medicine

## 2016-10-17 ENCOUNTER — Emergency Department (HOSPITAL_BASED_OUTPATIENT_CLINIC_OR_DEPARTMENT_OTHER): Payer: PRIVATE HEALTH INSURANCE

## 2016-10-17 ENCOUNTER — Encounter (HOSPITAL_BASED_OUTPATIENT_CLINIC_OR_DEPARTMENT_OTHER): Payer: Self-pay

## 2016-10-17 DIAGNOSIS — Z87891 Personal history of nicotine dependence: Secondary | ICD-10-CM | POA: Diagnosis not present

## 2016-10-17 DIAGNOSIS — M25511 Pain in right shoulder: Secondary | ICD-10-CM | POA: Insufficient documentation

## 2016-10-17 DIAGNOSIS — M25512 Pain in left shoulder: Secondary | ICD-10-CM | POA: Diagnosis not present

## 2016-10-17 DIAGNOSIS — Z791 Long term (current) use of non-steroidal anti-inflammatories (NSAID): Secondary | ICD-10-CM | POA: Insufficient documentation

## 2016-10-17 DIAGNOSIS — Z7982 Long term (current) use of aspirin: Secondary | ICD-10-CM | POA: Insufficient documentation

## 2016-10-17 MED ORDER — NAPROXEN 500 MG PO TABS: 500.0000 mg | ORAL_TABLET | Freq: Two times a day (BID) | ORAL | 0 refills | Status: DC

## 2016-10-17 MED ORDER — TRAMADOL HCL 50 MG PO TABS: 50.0000 mg | ORAL_TABLET | Freq: Four times a day (QID) | ORAL | 0 refills | Status: DC | PRN

## 2016-10-17 NOTE — ED Triage Notes (Addendum)
Pt reports bilateral inner shoulder pain, thinks his rotator cuff is torn, onset Monday, reports repetitive lifting at work. Pain radiates into neck. Exacerbated by movement. Bayer taken PTA.

## 2016-10-17 NOTE — ED Provider Notes (Signed)
[facility]   CSN: [id number] Arrival date & time: 10/17/16  2016  By signing my name below, I, Modena JanskyAlbert Thayil, attest that this documentation has been prepared under the direction and in the presence of Geoffery Lyonselo, Gerold Sar, MD. Electronically Signed: Modena JanskyAlbert Thayil, Scribe. 10/17/2016. 10:19 PM.  History   Chief Complaint Chief Complaint  Patient presents with  . Shoulder Pain   The history is provided by the patient. No language interpreter was used.  Shoulder Pain   This is a new problem. The current episode started more than 2 days ago. The problem occurs constantly. The problem has been gradually worsening. The pain is present in the left shoulder and right shoulder. The quality of the pain is described as constant. The pain is moderate. Pertinent negatives include no numbness and no tingling. The symptoms are aggravated by activity. He has tried nothing for the symptoms. There has been no history of extremity trauma.   HPI Comments: Henry Ramsey is a 41 y.o. male who presents to the Emergency Department complaining of constant moderate bilateral anterior shoulder pain that started 3 days ago. He states he has frequent heavy lifting with his job. No known specific injury. His gradually worsening pain is exacerbated by BUE movement and radiates to his neck. Denies any hx of shoulder pain, numbness/tingling, weakness, or other complaints at this time.  History reviewed. No pertinent past medical history.  There are no active problems to display for this patient.   History reviewed. No pertinent surgical history.     Home Medications    Prior to Admission medications   Medication Sig Start Date End Date Taking? Authorizing Provider  aspirin-acetaminophen-caffeine (EXCEDRIN MIGRAINE) [id number] MG tablet Take by mouth every 6 (six) hours as needed for headache.   Yes [provider]  Aspirin-Caffeine (BAYER BACK & BODY PO) Take by mouth.   Yes [provider]  amoxicillin (AMOXIL) 500 MG capsule Take 1 capsule (500 mg total) by mouth 3 (three) times daily. 06/24/16   Harris, Cammy CopaAbigail, PA-C  naproxen (NAPROSYN) 500 MG tablet Take 1 tablet (500 mg total) by mouth 2 (two) times daily. 10/17/16   Geoffery Lyonselo, Karena Kinker, MD  traMADol (ULTRAM) 50 MG tablet Take 1 tablet (50 mg total) by mouth every 6 (six) hours as needed. 10/17/16   Geoffery Lyonselo, Dawnisha Marquina, MD    Family History Family History  Problem Relation Age of Onset  . Cancer Mother        lung CA  . Multiple sclerosis Mother   . Hypertension Father     Social History Social History  Substance Use Topics  . Smoking status: Former Smoker    Quit date: 03/07/2010  . Smokeless tobacco: Never Used  . Alcohol use No     Allergies   Tylenol [acetaminophen]   Review of Systems Review of Systems  Constitutional: Negative for fever.  Musculoskeletal: Positive for myalgias and neck pain.  Neurological: Negative for tingling and numbness.  All other systems reviewed and are negative.    Physical Exam Updated Vital Signs BP 123/79 (BP Location: Right Arm)   Pulse 60   Temp 97.9 F (36.6 C) (Oral)   Resp 18   Ht 6' (1.829 m)   Wt 175 lb (79.4 kg)   SpO2 97%   BMI 23.73 kg/m   Physical Exam  Constitutional: He appears well-developed and well-nourished. No distress.  HENT:  Head: Normocephalic and atraumatic.  Eyes: Conjunctivae are normal.  Neck: Neck supple.  Cardiovascular: Normal rate.   Pulmonary/Chest: Effort normal.  Abdominal: Soft.  Musculoskeletal: Normal range of motion.  TTP to the anterior deltoid of both shoulders. Pain with arm extension. No crepitus. Ulnar and radial pulses are easily palpable. Motor and sensation are intact to both hands.   Neurological: He is alert.  Skin: Skin is warm and dry.  Psychiatric: He has a normal mood and affect.  Nursing note and vitals reviewed.    ED Treatments / Results  DIAGNOSTIC STUDIES: Oxygen Saturation is 97% on RA,  normal by my interpretation.    COORDINATION OF CARE: 10:21 PM- Pt advised of plan for treatment and pt agrees.  Labs (all labs ordered are listed, but only abnormal results are displayed) Labs Reviewed - No data to display  EKG  EKG Interpretation None       Radiology Dg Shoulder Right  Result Date: 10/17/2016 CLINICAL DATA:  Acute onset of bilateral shoulder pain, after repetitive lifting at work. Initial encounter. EXAM: RIGHT SHOULDER - 2+ VIEW COMPARISON:  Chest radiograph performed 05/14/2016 FINDINGS: There is no evidence of fracture or dislocation. The right humeral head is seated within the glenoid fossa. The acromioclavicular joint is unremarkable in appearance. No significant soft tissue abnormalities are seen. The visualized portions of the right lung are clear. IMPRESSION: No evidence of fracture or dislocation. Electronically Signed   By: Roanna Raider M.D.   On: 10/17/2016 21:29   Dg Shoulder Left  Result Date: 10/17/2016 CLINICAL DATA:  Acute onset of bilateral shoulder pain after repetitive lifting. Initial encounter. EXAM: LEFT SHOULDER - 2+ VIEW COMPARISON:  None. FINDINGS: There is no evidence of fracture or dislocation. The left humeral head is seated within the glenoid fossa. Degenerative change is noted at the left acromioclavicular joint. No significant soft tissue abnormalities are seen. The visualized portions of the left lung are clear. IMPRESSION: 1. No evidence of fracture or dislocation. 2. Degenerative change at the left acromioclavicular joint. Electronically Signed   By: Roanna Raider M.D.   On: 10/17/2016 21:28    Procedures Procedures (including critical care time)  Medications Ordered in ED Medications - No data to display   Initial Impression / Assessment and Plan / ED Course  I have reviewed the triage vital signs and the nursing notes.  Pertinent labs & imaging results that were available during my care of the patient were reviewed by me  and considered in my medical decision making (see chart for details).  Patient with bilateral shoulder pain in the absence of any specific injury or trauma. He does perform heavy lifting and repetitive motion at work and I suspect this is the cause. He will be treated with rest, anti-inflammatories, and is to follow-up as needed if not improving.  Final Clinical Impressions(s) / ED Diagnoses   Final diagnoses:  Acute pain of both shoulders    New Prescriptions Discharge Medication List as of 10/17/2016 10:25 PM    START taking these medications   Details  naproxen (NAPROSYN) 500 MG tablet Take 1 tablet (500 mg total) by mouth 2 (two) times daily., Starting Thu 10/17/2016, Print    traMADol (ULTRAM) 50 MG tablet Take 1 tablet (50 mg total) by mouth every 6 (six) hours as needed., Starting Thu 10/17/2016, Print       I personally performed the services described in this documentation, which was scribed in my presence. The recorded information has been reviewed and is accurate.        Geoffery Lyons, MD  10/17/16 2342  

## 2016-10-17 NOTE — Discharge Instructions (Signed)
Naproxen as prescribed.  Tramadol as prescribed as needed for pain not relieved with naproxen.  Rest.  Follow-up with your primary Dr. if you're not improving in the next 1-2 weeks.

## 2017-06-03 ENCOUNTER — Encounter (HOSPITAL_BASED_OUTPATIENT_CLINIC_OR_DEPARTMENT_OTHER): Payer: Self-pay | Admitting: *Deleted

## 2017-06-03 ENCOUNTER — Emergency Department (HOSPITAL_BASED_OUTPATIENT_CLINIC_OR_DEPARTMENT_OTHER)
Admission: EM | Admit: 2017-06-03 | Discharge: 2017-06-03 | Disposition: A | Discharge status: Home / Self Care | Payer: PRIVATE HEALTH INSURANCE | Attending: Emergency Medicine | Admitting: Emergency Medicine

## 2017-06-03 ENCOUNTER — Other Ambulatory Visit: Payer: Self-pay

## 2017-06-03 DIAGNOSIS — Z79899 Other long term (current) drug therapy: Secondary | ICD-10-CM | POA: Insufficient documentation

## 2017-06-03 DIAGNOSIS — Z87891 Personal history of nicotine dependence: Secondary | ICD-10-CM | POA: Insufficient documentation

## 2017-06-03 DIAGNOSIS — L0231 Cutaneous abscess of buttock: Secondary | ICD-10-CM | POA: Insufficient documentation

## 2017-06-03 DIAGNOSIS — L0291 Cutaneous abscess, unspecified: Secondary | ICD-10-CM

## 2017-06-03 MED ORDER — OXYCODONE HCL 5 MG PO TABS
5.0000 mg | ORAL_TABLET | Freq: Once | ORAL | Status: AC
Start: 2017-06-03 — End: 2017-06-03
  Administered 2017-06-03: 5 mg via ORAL
  Filled 2017-06-03: qty 1

## 2017-06-03 MED ORDER — CEPHALEXIN 500 MG PO CAPS: 500.0000 mg | ORAL_CAPSULE | Freq: Four times a day (QID) | ORAL | 0 refills | Status: DC

## 2017-06-03 MED ORDER — OXYCODONE HCL 5 MG PO TABS: 5.0000 mg | ORAL_TABLET | Freq: Four times a day (QID) | ORAL | 0 refills | Status: DC | PRN

## 2017-06-03 MED ORDER — LIDOCAINE HCL (PF) 1 % IJ SOLN
10.0000 mL | Freq: Once | INTRAMUSCULAR | Status: AC
  Administered 2017-06-03: 10 mL
  Filled 2017-06-03: qty 10

## 2017-06-03 NOTE — ED Provider Notes (Signed)
[facility] EMERGENCY DEPARTMENT Provider Note   CSN: [id number] Arrival date & time: 06/03/17  1412     History   Chief Complaint Chief Complaint  Patient presents with  . Abscess    HPI Henry Ramsey is a 42 y.o. male.  Patient reports onset of abscess at the upper aspect of the gluteal cleft yesterday. Patient complaining of increasing pain, especially with sitting.    Abscess  Location:  Pelvis Pelvic abscess location:  Gluteal cleft Abscess quality: induration and painful   Red streaking: no   Duration:  2 days Progression:  Worsening Pain details:    Quality:  Throbbing   Severity:  Severe   Timing:  Constant   Progression:  Worsening Chronicity:  New Associated symptoms: no fever     History reviewed. No pertinent past medical history.  There are no active problems to display for this patient.   History reviewed. No pertinent surgical history.     Home Medications    Prior to Admission medications   Medication Sig Start Date End Date Taking? Authorizing Provider  amoxicillin (AMOXIL) 500 MG capsule Take 1 capsule (500 mg total) by mouth 3 (three) times daily. 06/24/16   Arthor CaptainHarris, Abigail, PA-C  aspirin-acetaminophen-caffeine (EXCEDRIN MIGRAINE) [id number] MG tablet Take by mouth every 6 (six) hours as needed for headache.    [provider]  Aspirin-Caffeine (BAYER BACK & BODY PO) Take by mouth.    [provider]  naproxen (NAPROSYN) 500 MG tablet Take 1 tablet (500 mg total) by mouth 2 (two) times daily. 10/17/16   Geoffery Lyonselo, Douglas, MD  traMADol (ULTRAM) 50 MG tablet Take 1 tablet (50 mg total) by mouth every 6 (six) hours as needed. 10/17/16   Geoffery Lyonselo, Douglas, MD    Family History Family History  Problem Relation Age of Onset  . Cancer Mother        lung CA  . Multiple sclerosis Mother   . Hypertension Father     Social History Social History   Tobacco Use  . Smoking status: Former Smoker    Last attempt to quit:  03/07/2010    Years since quitting: 7.2  . Smokeless tobacco: Never Used  Substance Use Topics  . Alcohol use: No  . Drug use: No     Allergies   Tylenol [acetaminophen]   Review of Systems Review of Systems  Constitutional: Negative for fever.  All other systems reviewed and are negative.    Physical Exam Updated Vital Signs BP 113/74   Pulse (!) 55   Temp 98.6 F (37 C) (Oral)   Resp 18   Ht 5\' 11"  (1.803 m)   Wt 79.4 kg (175 lb)   SpO2 100%   BMI 24.41 kg/m   Physical Exam  Constitutional: He is oriented to person, place, and time. He appears well-developed and well-nourished.  HENT:  Head: Normocephalic.  Eyes: Conjunctivae are normal.  Neck: Neck supple.  Cardiovascular: Normal rate and regular rhythm.  Pulmonary/Chest: Effort normal and breath sounds normal.  Abdominal: Soft. Bowel sounds are normal.  Musculoskeletal: Normal range of motion.  Lymphadenopathy:    He has no cervical adenopathy.  Neurological: He is alert and oriented to person, place, and time.  Skin: Skin is warm and dry.  1 x .75 cm area of induration at the midline of upper gluteal cleft.  Psychiatric: He has a normal mood and affect.  Nursing note and vitals reviewed.    ED Treatments / Results  Labs (all labs ordered are listed, but only abnormal results are displayed) Labs Reviewed - No data to display  EKG  EKG Interpretation None       Radiology No results found.  Procedures .Marland KitchenIncision and Drainage Date/Time: 06/03/2017 6:23 PM Performed by: Felicie Morn, NP Authorized by: Felicie Morn, NP   Consent:    Consent obtained:  Verbal   Consent given by:  Patient   Risks discussed:  Bleeding, incomplete drainage and pain   Alternatives discussed:  Delayed treatment Location:    Type:  Abscess   Location:  Trunk   Trunk location:  Back Pre-procedure details:    Skin preparation:  Betadine Anesthesia (see MAR for exact dosages):    Anesthesia method:  Local  infiltration   Local anesthetic:  Lidocaine 1% w/o epi Procedure type:    Complexity:  Simple Procedure details:    Incision types:  Single straight   Incision depth:  Dermal   Scalpel blade:  11   Wound management:  Probed and deloculated and irrigated with saline   Drainage:  Bloody and purulent   Drainage amount:  Scant   Packing materials:  None Post-procedure details:    Patient tolerance of procedure:  Tolerated well, no immediate complications   (including critical care time)  Medications Ordered in ED Medications  oxyCODONE (Oxy IR/ROXICODONE) immediate release tablet 5 mg (not administered)     Initial Impression / Assessment and Plan / ED Course  I have reviewed the triage vital signs and the nursing notes.  Pertinent labs & imaging results that were available during my care of the patient were reviewed by me and considered in my medical decision making (see chart for details).     Patient with skin abscess. Incision and drainage performed in the ED today.  Abscess was not large enough to warrant packing or drain placement. Recommend surgical follow-up with suspicion for pilonidal cyst. Supportive care and return precautions discussed.  Pt sent home with keflex. The patient appears reasonably screened and/or stabilized for discharge and I doubt any other emergent medical condition requiring further screening, evaluation, or treatment in the ED prior to discharge.   Final Clinical Impressions(s) / ED Diagnoses   Final diagnoses:  Abscess    ED Discharge Orders        Ordered    cephALEXin (KEFLEX) 500 MG capsule  4 times daily     06/03/17 1830    oxyCODONE (ROXICODONE) 5 MG immediate release tablet  Every 6 hours PRN     06/03/17 1831       Felicie Morn, NP 06/03/17 1836    Tegeler, Canary Brim, MD 06/04/17 [phone]

## 2017-06-03 NOTE — ED Triage Notes (Signed)
Pilonidal cyst since yesterday.

## 2017-06-03 NOTE — ED Notes (Signed)
NAD at this time. Pt is stable and going home.  

## 2017-09-02 ENCOUNTER — Emergency Department (HOSPITAL_BASED_OUTPATIENT_CLINIC_OR_DEPARTMENT_OTHER)
Admission: EM | Admit: 2017-09-02 | Discharge: 2017-09-02 | Disposition: A | Discharge status: Home / Self Care | Payer: BLUE CROSS/BLUE SHIELD | Attending: Emergency Medicine | Admitting: Emergency Medicine

## 2017-09-02 ENCOUNTER — Encounter (HOSPITAL_BASED_OUTPATIENT_CLINIC_OR_DEPARTMENT_OTHER): Payer: Self-pay

## 2017-09-02 ENCOUNTER — Other Ambulatory Visit: Payer: Self-pay

## 2017-09-02 DIAGNOSIS — Z79899 Other long term (current) drug therapy: Secondary | ICD-10-CM | POA: Diagnosis not present

## 2017-09-02 DIAGNOSIS — M545 Low back pain, unspecified: Secondary | ICD-10-CM

## 2017-09-02 DIAGNOSIS — Z87891 Personal history of nicotine dependence: Secondary | ICD-10-CM | POA: Diagnosis not present

## 2017-09-02 MED ORDER — CYCLOBENZAPRINE HCL 5 MG PO TABS
5.0000 mg | ORAL_TABLET | Freq: Once | ORAL | Status: AC
  Administered 2017-09-02: 5 mg via ORAL
  Filled 2017-09-02: qty 1

## 2017-09-02 MED ORDER — METHOCARBAMOL 500 MG PO TABS: 500.0000 mg | ORAL_TABLET | Freq: Every evening | ORAL | 0 refills | Status: DC | PRN

## 2017-09-02 MED ORDER — LIDOCAINE 5 % EX PTCH: 1.0000 | MEDICATED_PATCH | CUTANEOUS | 0 refills | Status: DC

## 2017-09-02 MED ORDER — MELOXICAM 15 MG PO TABS: 15.0000 mg | ORAL_TABLET | Freq: Every day | ORAL | 0 refills | Status: DC

## 2017-09-02 MED ORDER — KETOROLAC TROMETHAMINE 30 MG/ML IJ SOLN
30.0000 mg | Freq: Once | INTRAMUSCULAR | Status: AC
  Administered 2017-09-02: 30 mg via INTRAMUSCULAR
  Filled 2017-09-02: qty 1

## 2017-09-02 NOTE — Discharge Instructions (Signed)
You were seen here today for Back Pain: Low back pain is discomfort in the lower back that may be due to injuries to muscles and ligaments around the spine. Occasionally, it may be caused by a problem to a part of the spine called a disc. Your back pain should be treated with medicines listed below as well as back exercises and this back pain should get better over the next 2 weeks. Most patients get completely well in 4 weeks. It is important to know however, if you develop severe or worsening pain, low back pain with fever, numbness, weakness or inability to walk or urinate, you should return to the ER immediately.  Please follow up with your doctor this week for a recheck if still having symptoms.  HOME INSTRUCTIONS Self - care:  The application of heat can help soothe the pain.  Maintaining your daily activities, including walking (this is encouraged), as it will help you get better faster than just staying in bed. Do not life, push, pull anything more than 10 pounds for the next week. I am attaching back exercises that you can do at home to help facilitate your recovery.   Back Exercises - I have attached a handout on back exercises that can be done at home to help facilitate your recovery.   Medications are also useful to help with pain control.   Acetaminophen.  This medication is generally safe, and found over the counter. Take as directed for your age. You should not take more than 8 of the extra strength ( ) pills a day (max dose is  total OVER one day)  Non steroidal anti inflammatory: This includes medications including Ibuprofen, naproxen and Mobic; These medications help both pain and swelling and are very useful in treating back pain.  They should be taken with food, as they can cause stomach upset, and more seriously, stomach bleeding. Do not combine the medications.   Muscle relaxants:  These medications can help with muscle tightness that is a cause of lower back pain.  Most  of these medications can cause drowsiness, and it is not safe to drive or use dangerous machinery while taking them. They are primarily helpful when taken at night before sleep.  You will need to follow up with your primary healthcare provider or the sports medicine in 1-2 weeks for reassessment and persistent symptoms.  Be aware that if you develop new symptoms, such as a fever, leg weakness, difficulty with or loss of control of your urine or bowels, abdominal pain, or more severe pain, you will need to seek medical attention and/or return to the Emergency department. Additional Information:  Your vital signs today were: BP 121/87 (BP Location: Left Arm)    Pulse 71    Temp 98.3 F (36.8 C) (Oral)    Resp 18    SpO2 100%  If your blood pressure (BP) was elevated above 135/85 this visit, please have this repeated by your doctor within one month. ---------------

## 2017-09-02 NOTE — ED Provider Notes (Signed)
[facility] EMERGENCY DEPARTMENT Provider Note   CSN: [id number] Arrival date & time: 09/02/17  1240     History   Chief Complaint Chief Complaint  Patient presents with  . Back Pain    HPI Henry Ramsey is a 42 y.o. male with out any significant past medical history presents the emergency department today for left lower back pain.  Patient states that on Friday, 08/29/2017 he was getting out of his truck when he felt a pulling sensation of his left lower back.  Since that time he has been having a dull, achy pain in his left lower back that is worse with ambulation, bending down as well as twisting motions.  Triage note states that the pain radiates into his left hip as well as his left lower extremity but he denies this to myself.  He notes that he has not taken anything for his pain.  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 The patient denies history of cancer, trauma, fever, weight loss or night pain, IV drug use, recent spinal manipulation or procedures, upper back pain or neck pain, numbness/tingling/weakness of the lower extremities, urinary retention, loss of bowel/bladder function, saddle anesthesia. Denies dysuria, flank pain, suprapubic pain, frequency, urgency, or hematuria.   HPI  History reviewed. No pertinent past medical history.  There are no active problems to display for this patient.   History reviewed. No pertinent surgical history.      Home Medications    Prior to Admission medications   Medication Sig Start Date End Date Taking? Authorizing Provider  aspirin-acetaminophen-caffeine (EXCEDRIN MIGRAINE) [phone] MG tablet Take by mouth every 6 (six) hours as needed for headache.    [provider]  Aspirin-Caffeine (BAYER BACK & BODY PO) Take by mouth.    [provider]  naproxen (NAPROSYN) 500 MG tablet Take 1 tablet (500 mg total)  by mouth 2 (two) times daily. 10/17/16   Geoffery Lyons, MD  oxyCODONE (ROXICODONE) 5 MG immediate release tablet Take 1 tablet (5 mg total) by mouth every 6 (six) hours as needed for severe pain. 06/03/17   Felicie Morn, NP    Family History Family History  Problem Relation Age of Onset  . Cancer Mother        lung CA  . Multiple sclerosis Mother   . Hypertension Father     Social History Social History   Tobacco Use  . Smoking status: Former Smoker    Last attempt to quit: 03/07/2010    Years since quitting: 7.4  . Smokeless tobacco: Never Used  Substance Use Topics  . Alcohol use: No  . Drug use: No     Allergies   Tylenol [acetaminophen]   Review of Systems Review of Systems  All other systems reviewed and are negative.    Physical Exam Updated Vital Signs BP 121/87 (BP Location: Left Arm)   Pulse 71   Temp 98.3 F (36.8 C) (Oral)   Resp 18   SpO2 100%   Physical Exam  Constitutional: He appears well-developed and well-nourished. No distress.  Non-toxic appearing  HENT:  Head: Normocephalic and atraumatic.  Right Ear: External ear normal.  Left Ear: External ear normal.  Neck: Normal range of motion. Neck supple. No spinous process tenderness present. No neck rigidity. Normal range of motion present.  Cardiovascular: Normal rate, regular rhythm, normal heart sounds and intact distal pulses.  No murmur heard. Pulses:      Radial pulses are 2+ on the right side, and 2+ on the left side.       Femoral pulses are 2+ on the right side, and 2+ on the left side.      Dorsalis pedis pulses are 2+ on the right side, and 2+ on the left side.       Posterior tibial pulses are 2+ on the right side, and 2+ on the left side.  Pulmonary/Chest: Effort normal and breath sounds normal. No respiratory distress.  Abdominal: Soft. Bowel sounds are normal. He exhibits no pulsatile midline mass. There is no tenderness. There is no rigidity, no rebound and no CVA tenderness.    Musculoskeletal:  Posterior and appearance appears normal. No evidence of obvious scoliosis or kyphosis. No obvious signs of skin changes, trauma, deformity, infection. No C, T, or L spine tenderness or step-offs to palpation. No C, T paraspinal tenderness. Left lumbar paraspinal TTP. Lung expansion normal. Bilateral lower extremity strength 5 out of 5 including extensor hallucis longus. Patellar and Achilles deep tendon reflex 2+ and equal bilaterally. Sensation of lower extremities grossly intact. Straight leg right negative. Straight leg left negative. Gait able but patient notes painful. Lower extremity compartments soft. PT and DP 2+ b/l. Cap refill <2 seconds.   Neurological: He is alert.  Reflex Scores:      Patellar reflexes are 2+ on the right side and 2+ on the left side.      Achilles reflexes are 2+ on the right side and 2+ on the left side. No foot drop. Normal sensation of the lower extremity. Patellar and achilles DTR 2+ and symmetric. Normal plantar flexion and dorsiflexion strength b/l.   Skin: Skin is warm, dry and intact. Capillary refill takes less than 2 seconds. No rash noted. He is not diaphoretic. No erythema.  No vesicular-like rash noted.  Nursing note and vitals reviewed.    ED Treatments / Results  Labs (all labs ordered are listed, but only abnormal results are displayed) Labs Reviewed - No data to display  EKG None  Radiology No results found.  Procedures Procedures (including critical care time)  Medications Ordered in ED Medications  ketorolac (TORADOL) 30 MG/ML injection 30 mg (has no administration in time range)  cyclobenzaprine (FLEXERIL) tablet 5 mg (has no administration in time range)     Initial Impression / Assessment and Plan / ED Course  I have reviewed the triage vital signs and the nursing notes.  Pertinent labs & imaging results that were available during my care of the patient were reviewed by me and considered in my medical  decision making (see chart for details).     42 y.o. year old male with back pain.  No neurologic deficits and normal neuro exam.  Patient can walk but states it is painful.  No bowel or bladder incontinence.  No urinary retention or saddle anesthesia.  No concern for cauda equina.  No history of trauma, personal history of cancer, night sweats or weight loss that would warrant a x-ray at this time.  No spinal injections, fever or IV drug use that make me concerned for spinal hematoma or abscess.  No pulsatile mass of the abdomen.  No abdominal tenderness to palpation or guarding to make me concern for intra-abdominal pathology.  No urinary symptoms or CVA tenderness to make me concerned for cystitis versus pyelonephritis versus kidney stone.  Suspect patient's symptoms are musculoskeletal in nature. No evidence  of sciatica with negative straight leg raise and no paresthesia's of the lower extremity.  Will treat the patient with back exercises, activity modification (will give note for work), muscle relaxers, NSAIDs (no history of kidney disease or GI bleed). Patient is to follow-up with PCP versus sports medicine.  Strict return precautions discussed.  Patient appears safe for discharge.  Final Clinical Impressions(s) / ED Diagnoses   Final diagnoses:  Acute left-sided low back pain without sciatica    ED Discharge Orders        Ordered    methocarbamol (ROBAXIN) 500 MG tablet  At bedtime PRN     09/02/17 1409    meloxicam (MOBIC) 15 MG tablet  Daily     09/02/17 1409    lidocaine (LIDODERM) 5 %  Every 24 hours     09/02/17 1409       Princella Pellegrini 09/02/17 1409    Azalia Bilis, MD 09/02/17 1454

## 2017-09-02 NOTE — ED Triage Notes (Signed)
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

## 2018-01-31 IMAGING — DX DG SHOULDER 2+V*L*
3 series · 3 of 3 positions shown · non-contrast
Comparison: None.

CLINICAL DATA: Acute onset of bilateral shoulder pain after
repetitive lifting. Initial encounter.

EXAM:
LEFT SHOULDER - 2+ VIEW

[shoulder grashey]
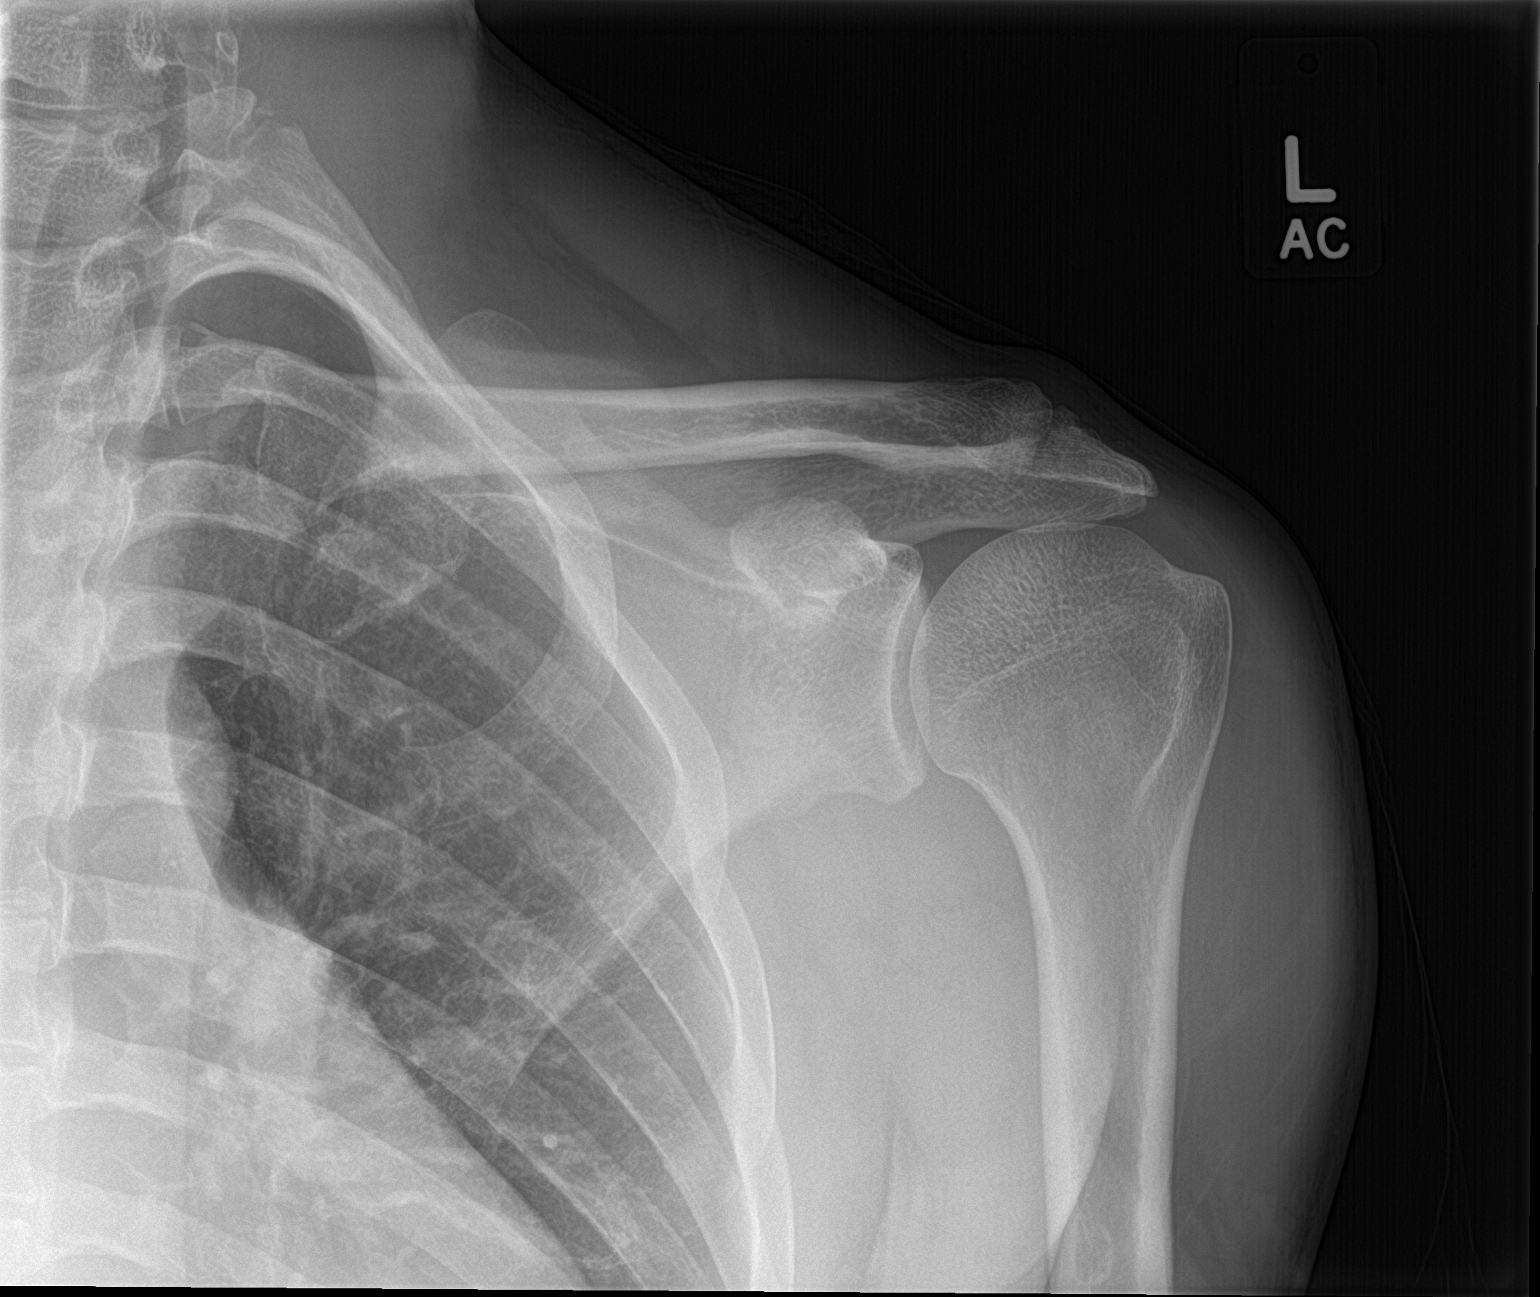

[shoulder y view]
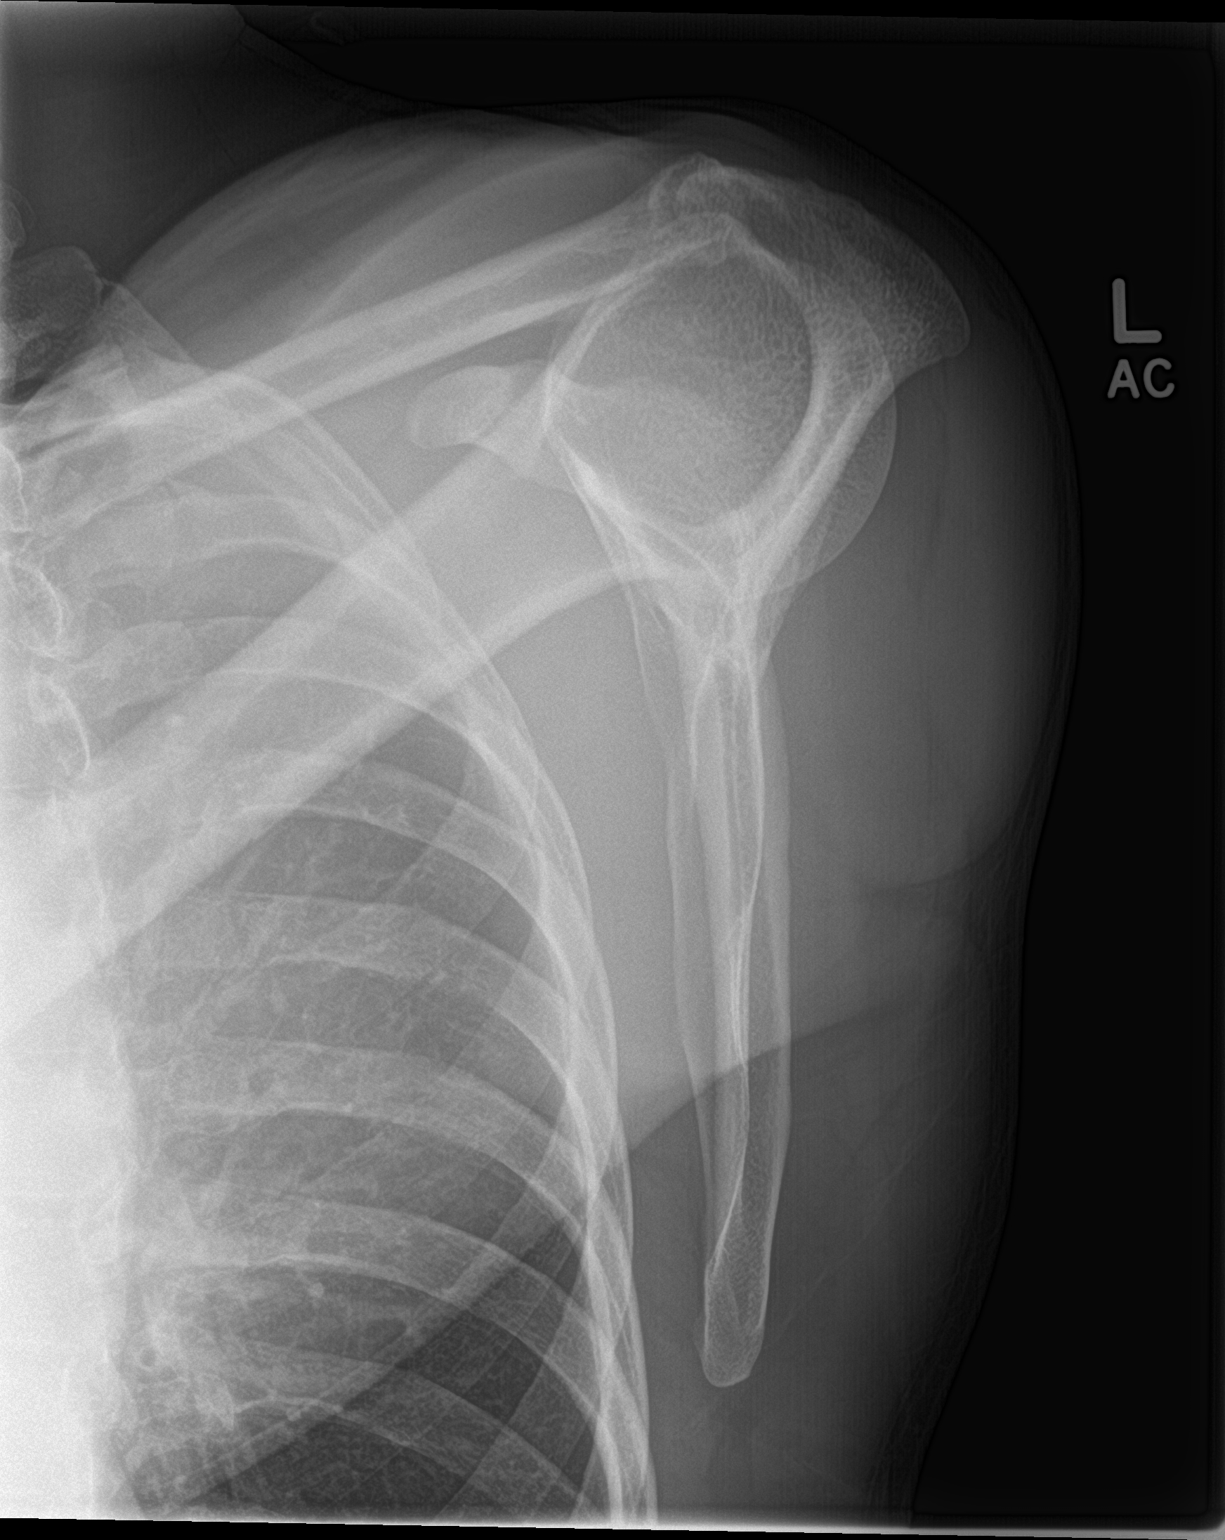

[shoulder axillary]
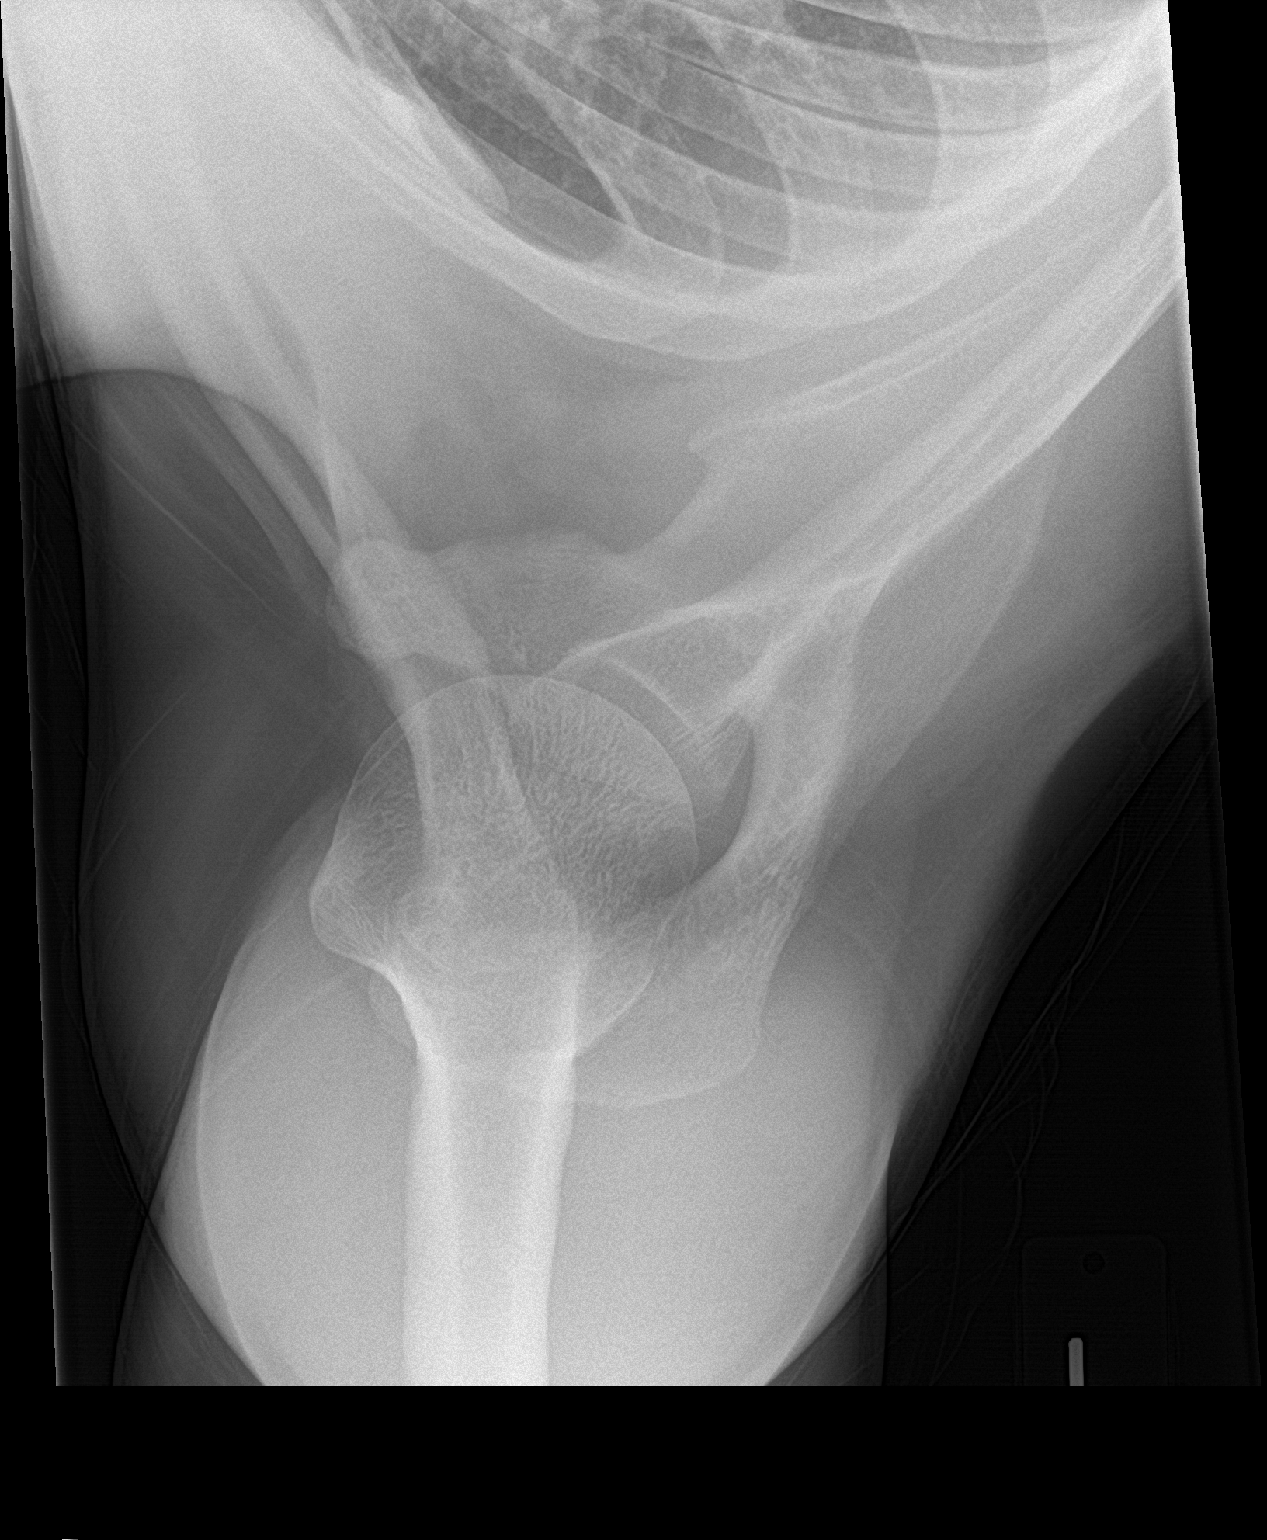

[3 of 3 positions shown; findings below may reference images not displayed]

FINDINGS: There is no evidence of fracture or dislocation. The left humeral
head is seated within the glenoid fossa. Degenerative change is
noted at the left acromioclavicular joint. No significant soft
tissue abnormalities are seen. The visualized portions of the left
lung are clear.
IMPRESSION: 1. No evidence of fracture or dislocation.
2. Degenerative change at the left acromioclavicular joint.

## 2019-06-19 DIAGNOSIS — M545 Low back pain, unspecified: Secondary | ICD-10-CM | POA: Insufficient documentation

## 2020-12-14 DIAGNOSIS — Z723 Lack of physical exercise: Secondary | ICD-10-CM | POA: Insufficient documentation

## 2021-03-16 DIAGNOSIS — Z6828 Body mass index (BMI) 28.0-28.9, adult: Secondary | ICD-10-CM | POA: Insufficient documentation

## 2021-06-27 DIAGNOSIS — R222 Localized swelling, mass and lump, trunk: Secondary | ICD-10-CM | POA: Insufficient documentation

## 2021-09-13 DIAGNOSIS — E785 Hyperlipidemia, unspecified: Secondary | ICD-10-CM | POA: Insufficient documentation

## 2022-01-20 DIAGNOSIS — R3589 Other polyuria: Secondary | ICD-10-CM | POA: Insufficient documentation

## 2022-02-19 DIAGNOSIS — E1165 Type 2 diabetes mellitus with hyperglycemia: Secondary | ICD-10-CM | POA: Insufficient documentation

## 2022-08-26 DIAGNOSIS — R1084 Generalized abdominal pain: Secondary | ICD-10-CM | POA: Insufficient documentation

## 2022-12-27 ENCOUNTER — Encounter: Payer: Self-pay | Admitting: Nurse Practitioner

## 2022-12-27 ENCOUNTER — Ambulatory Visit (INDEPENDENT_AMBULATORY_CARE_PROVIDER_SITE_OTHER): Payer: 59 | Admitting: Nurse Practitioner

## 2022-12-27 VITALS — BP 118/80 | HR 68 | Temp 98.2°F | Ht 71.0 in | Wt 205.6 lb

## 2022-12-27 DIAGNOSIS — E663 Overweight: Secondary | ICD-10-CM | POA: Diagnosis not present

## 2022-12-27 DIAGNOSIS — E119 Type 2 diabetes mellitus without complications: Secondary | ICD-10-CM | POA: Insufficient documentation

## 2022-12-27 DIAGNOSIS — Z7984 Long term (current) use of oral hypoglycemic drugs: Secondary | ICD-10-CM

## 2022-12-27 DIAGNOSIS — K219 Gastro-esophageal reflux disease without esophagitis: Secondary | ICD-10-CM

## 2022-12-27 DIAGNOSIS — Z794 Long term (current) use of insulin: Secondary | ICD-10-CM

## 2022-12-27 DIAGNOSIS — Z1211 Encounter for screening for malignant neoplasm of colon: Secondary | ICD-10-CM | POA: Diagnosis not present

## 2022-12-27 MED ORDER — BLOOD GLUCOSE TEST VI STRP
1.0000 | ORAL_STRIP | Freq: Three times a day (TID) | 6 refills | Status: AC
Start: 2022-12-27 — End: 2023-01-26

## 2022-12-27 MED ORDER — LANCET DEVICE MISC: 1.0000 | Freq: Three times a day (TID) | 0 refills | Status: AC

## 2022-12-27 MED ORDER — PANTOPRAZOLE SODIUM 40 MG PO TBEC
40.0000 mg | DELAYED_RELEASE_TABLET | Freq: Every day | ORAL | 3 refills | Status: AC
Start: 2022-12-27 — End: ?

## 2022-12-27 MED ORDER — ROSUVASTATIN CALCIUM 20 MG PO TABS
20.0000 mg | ORAL_TABLET | Freq: Every day | ORAL | 3 refills | Status: DC
Start: 2022-12-27 — End: 2023-02-14

## 2022-12-27 MED ORDER — BLOOD GLUCOSE MONITORING SUPPL DEVI: 1.0000 | Freq: Three times a day (TID) | 0 refills | Status: AC

## 2022-12-27 MED ORDER — LANCETS MISC. MISC
1.0000 | Freq: Three times a day (TID) | 6 refills | Status: AC
Start: 2022-12-27 — End: 2023-01-26

## 2022-12-27 NOTE — Assessment & Plan Note (Signed)
Chronic, stable Pantoprazole prescription sent to pharmacy.

## 2022-12-27 NOTE — Assessment & Plan Note (Signed)
Chronic Labs ordered, further recommendations may be made based upon these results Patient to continue on metformin 500 mg twice a day, Lantus 30 units daily.  Will check A1c and metabolic panel.  May restart Ozempic at 0.25 mg/week pending these results.

## 2022-12-27 NOTE — Assessment & Plan Note (Signed)
We had a shared decision-making discussion regarding colon cancer screening options.  Patient would like to move forward with Cologuard over colonoscopy.  Cologuard ordered.

## 2022-12-27 NOTE — Progress Notes (Signed)
New Patient Office Visit  Subjective    Patient ID: Henry Ramsey, male    DOB: 07-Jan-1976  Age: 47 y.o. MRN: [id number]  CC:  Chief Complaint  Patient presents with   Establish Care    Pt just moved back from [facility] and needs a PCP he is a Diabetic.   Previous Dr: Dr. Synetta Fail- [address] [place], [place], [place] [address]     HPI Henry Ramsey presents to establish care He is a 47 year old male with a past medical history of type 2 diabetes, but is treated like a type I.  Approximately 10 months ago he was found to be hypoglycemic with A1c greater than 15.  He was treated by his PCP and per patient last A1c improved down to 7.1.  He is currently on metformin 500 mg twice a day, and Lantus 30 units daily.  He was taking Ozempic 1 mg/week, but ran out of this a few weeks ago and has not been taking that.  He does not have a glucometer where he is checking his blood sugar routinely.  He is on statin therapy.  He is not on ACE or ARB.  He also has GERD and is requesting refill on his pantoprazole.  He is 47 and reports he has not undergone colon cancer screening as of yet.  Denies any personal history of colon cancer as well as any first-degree relative who has a history of colon cancer.  Denies personal history of ulcerative colitis or inflammatory bowel disease.  Outpatient Encounter Medications as of 12/27/2022  Medication Sig   Blood Glucose Monitoring Suppl DEVI 1 each by Does not apply route in the morning, at noon, and at bedtime. May substitute to any manufacturer covered by patient's insurance.   Glucose Blood (BLOOD GLUCOSE TEST STRIPS) STRP 1 each by In Vitro route in the morning, at noon, and at bedtime. May substitute to any manufacturer covered by patient's insurance.   insulin glargine (LANTUS) 100 UNIT/ML injection Inject 30 Units into the skin daily.   Lancet Device MISC 1 each by Does not apply route in the morning, at noon, and at bedtime. May substitute to any manufacturer covered by  patient's insurance.   Lancets Misc. MISC 1 each by Does not apply route in the morning, at noon, and at bedtime. May substitute to any manufacturer covered by patient's insurance.   metFORMIN (GLUCOPHAGE) 500 MG tablet Take 500 mg by mouth 2 (two) times daily.   [DISCONTINUED] OZEMPIC, 1 MG/DOSE, 4 MG/3ML SOPN Inject 1 mg into the skin once a week.   [DISCONTINUED] pantoprazole (PROTONIX) 40 MG tablet Take 40 mg by mouth daily.   [DISCONTINUED] rosuvastatin (CRESTOR) 20 MG tablet Take 20 mg by mouth daily.   pantoprazole (PROTONIX) 40 MG tablet Take 1 tablet (40 mg total) by mouth daily.   rosuvastatin (CRESTOR) 20 MG tablet Take 1 tablet (20 mg total) by mouth daily.   [DISCONTINUED] aspirin-acetaminophen-caffeine (EXCEDRIN MIGRAINE) 250-250-65 MG tablet Take by mouth every 6 (six) hours as needed for headache. (Patient not taking: Reported on 12/27/2022)   [DISCONTINUED] Aspirin-Caffeine (BAYER BACK & BODY PO) Take by mouth. (Patient not taking: Reported on 12/27/2022)   [DISCONTINUED] lidocaine (LIDODERM) 5 % Place 1 patch onto the skin daily. Remove & Discard patch within 12 hours or as directed by MD (Patient not taking: Reported on 12/27/2022)   [DISCONTINUED] meloxicam (MOBIC) 15 MG tablet Take 1 tablet (15 mg total) by mouth daily. (Patient not taking: Reported  on 12/27/2022)   [DISCONTINUED] methocarbamol (ROBAXIN) 500 MG tablet Take 1 tablet (500 mg total) by mouth at bedtime as needed for muscle spasms. (Patient not taking: Reported on 12/27/2022)   [DISCONTINUED] naproxen (NAPROSYN) 500 MG tablet Take 1 tablet (500 mg total) by mouth 2 (two) times daily. (Patient not taking: Reported on 12/27/2022)   [DISCONTINUED] oxyCODONE (ROXICODONE) 5 MG immediate release tablet Take 1 tablet (5 mg total) by mouth every 6 (six) hours as needed for severe pain. (Patient not taking: Reported on 12/27/2022)   No facility-administered encounter medications on file as of 12/27/2022.    Past Medical History:   Diagnosis Date   Diabetes mellitus without complication (HCC)     History reviewed. No pertinent surgical history.  Family History  Problem Relation Age of Onset   Cancer Mother        lung CA   Multiple sclerosis Mother    Hypertension Father     Social History   Socioeconomic History   Marital status: Not on file    Spouse name: Not on file   Number of children: Not on file   Years of education: Not on file   Highest education level: Not on file  Occupational History   Not on file  Tobacco Use   Smoking status: Former    Current packs/day: 0.00    Types: Cigarettes    Quit date: 03/07/2010    Years since quitting: 12.8   Smokeless tobacco: Never  Substance and Sexual Activity   Alcohol use: No   Drug use: No   Sexual activity: Yes    Birth control/protection: Condom  Other Topics Concern   Not on file  Social History Narrative   Not on file   Social Determinants of Health   Financial Resource Strain: Low Risk  (11/16/2020)   Received from [facility]   Financial Resource Strain    Difficulty Paying Living Expenses: Not hard at all    Difficulty Paying Medical Expenses: No  Food Insecurity: No Food Insecurity (11/16/2020)   Received from [facility]   Food Insecurity    Worried about Running Out of Food in the Last Year: Never true    Ran Out of Food in the Last Year: Never true  Transportation Needs: No Transportation Needs (11/16/2020)   Received from [facility]   Transportation Needs    Lack of Transportation: No  Physical Activity: Inactive (12/14/2020)   Received from [facility]   Physical Activity    Days of Exercise per Week: 0    Minutes of Exercise per Session: 0    Total Minutes of Exercise per Week: 0  Stress: No Stress Concern Present (11/16/2020)   Received from [facility]   Stress    Feeling of Stress : Not at all  Social Connections: Unknown (11/25/2021)   Received from [facility]   Social Connections    Frequency of  Communication with Friends and Family: Not asked    Frequency of Social Gatherings with Friends and Family: Not asked  Intimate Partner Violence: Unknown (11/25/2021)   Received from [facility]   Intimate Partner Violence    Fear of Current or Ex-Partner: Not asked    Emotionally Abused: Not asked    Physically Abused: Not asked    Sexually Abused: Not asked          Objective    BP 118/80 (BP Location: Left Arm, Patient Position: Sitting, Cuff Size: Normal)   Pulse 68  Temp 98.2 F (36.8 C) (Oral)   Ht 5\' 11"  (1.803 m)   Wt 205 lb 9.6 oz (93.3 kg)   SpO2 97%   BMI 28.68 kg/m   Physical Exam Vitals reviewed.  Constitutional:      Appearance: Normal appearance.  HENT:     Head: Normocephalic and atraumatic.  Cardiovascular:     Rate and Rhythm: Normal rate and regular rhythm.  Pulmonary:     Effort: Pulmonary effort is normal.     Breath sounds: Normal breath sounds.  Musculoskeletal:     Cervical back: Neck supple.  Skin:    General: Skin is warm and dry.  Neurological:     Mental Status: He is alert and oriented to person, place, and time.  Psychiatric:        Mood and Affect: Mood normal.        Behavior: Behavior normal.        Thought Content: Thought content normal.        Judgment: Judgment normal.         Assessment & Plan:   Problem List Items Addressed This Visit       Digestive   Gastroesophageal reflux disease    Chronic, stable Pantoprazole prescription sent to pharmacy.      Relevant Medications   pantoprazole (PROTONIX) 40 MG tablet     Endocrine   Type 2 diabetes mellitus without complication, with long-term current use of insulin (HCC) - Primary    Chronic Labs ordered, further recommendations may be made based upon these results Patient to continue on metformin 500 mg twice a day, Lantus 30 units daily.  Will check A1c and metabolic panel.  May restart Ozempic at 0.25 mg/week pending these results.      Relevant  Medications   metFORMIN (GLUCOPHAGE) 500 MG tablet   insulin glargine (LANTUS) 100 UNIT/ML injection   Blood Glucose Monitoring Suppl DEVI   Glucose Blood (BLOOD GLUCOSE TEST STRIPS) STRP   Lancet Device MISC   Lancets Misc. MISC   rosuvastatin (CRESTOR) 20 MG tablet   Other Relevant Orders   Ambulatory referral to Endocrinology   CBC   Hemoglobin A1c   Comprehensive metabolic panel   TSH   Lipid panel   Microalbumin / creatinine urine ratio     Other   Overweight    Chronic Labs ordered, further recommendations may be made based upon his results      Relevant Orders   CBC   Hemoglobin A1c   Comprehensive metabolic panel   TSH   Lipid panel   Microalbumin / creatinine urine ratio   Colon cancer screening    We had a shared decision-making discussion regarding colon cancer screening options.  Patient would like to move forward with Cologuard over colonoscopy.  Cologuard ordered.      Relevant Orders   Cologuard    Return in about 6 weeks (around 02/07/2023) for CPE with Adrean Heitz.   Elenore Paddy, NP

## 2022-12-27 NOTE — Assessment & Plan Note (Signed)
Chronic Labs ordered, further recommendations may be made based upon his results. 

## 2023-01-13 ENCOUNTER — Other Ambulatory Visit (INDEPENDENT_AMBULATORY_CARE_PROVIDER_SITE_OTHER): Payer: 59

## 2023-01-13 DIAGNOSIS — E663 Overweight: Secondary | ICD-10-CM

## 2023-01-13 DIAGNOSIS — Z794 Long term (current) use of insulin: Secondary | ICD-10-CM

## 2023-01-13 DIAGNOSIS — E119 Type 2 diabetes mellitus without complications: Secondary | ICD-10-CM

## 2023-01-13 LAB — COMPREHENSIVE METABOLIC PANEL
ALT: 21 U/L (ref 0–53)
AST: 20 U/L (ref 0–37)
Albumin: 4.4 g/dL (ref 3.5–5.2)
Alkaline Phosphatase: 41 U/L (ref 39–117)
BUN: 6 mg/dL (ref 6–23)
CO2: 32 meq/L (ref 19–32)
Calcium: 9.9 mg/dL (ref 8.4–10.5)
Chloride: 102 meq/L (ref 96–112)
Creatinine, Ser: 1.16 mg/dL (ref 0.40–1.50)
GFR: 76.24 mL/min (ref >60.00)
Glucose, Bld: 115 mg/dL — ABNORMAL HIGH (ref 70–99)
Potassium: 4 meq/L (ref 3.5–5.1)
Sodium: 140 meq/L (ref 135–145)
Total Bilirubin: 0.6 mg/dL (ref 0.2–1.2)
Total Protein: 7.4 g/dL (ref 6.0–8.3)

## 2023-01-13 LAB — CBC
HCT: 44.8 % (ref 39.0–52.0)
Hemoglobin: 14.2 g/dL (ref 13.0–17.0)
MCHC: 31.8 g/dL (ref 30.0–36.0)
MCV: 85.7 fl (ref 78.0–100.0)
Platelets: 229 10*3/uL (ref 150.0–400.0)
RBC: 5.23 Mil/uL (ref 4.22–5.81)
RDW: 14.4 % (ref 11.5–15.5)
WBC: 6.6 10*3/uL (ref 4.0–10.5)

## 2023-01-13 LAB — LIPID PANEL
Cholesterol: 110 mg/dL (ref 0–200)
HDL: 38.4 mg/dL — ABNORMAL LOW (ref >39.00)
LDL Cholesterol: 56 mg/dL (ref 0–99)
NonHDL: 71.46
Total CHOL/HDL Ratio: 3
Triglycerides: 77 mg/dL (ref 0.0–149.0)
VLDL: 15.4 mg/dL (ref 0.0–40.0)

## 2023-01-13 LAB — MICROALBUMIN / CREATININE URINE RATIO
Creatinine,U: 261 mg/dL
Microalb Creat Ratio: 0.8 mg/g (ref 0.0–30.0)
Microalb, Ur: 2.1 mg/dL — ABNORMAL HIGH (ref 0.0–1.9)

## 2023-01-13 LAB — HEMOGLOBIN A1C: Hgb A1c MFr Bld: 7.5 % — ABNORMAL HIGH (ref 4.6–6.5)

## 2023-01-13 LAB — TSH: TSH: 1.62 u[IU]/mL (ref 0.35–5.50)

## 2023-01-14 ENCOUNTER — Other Ambulatory Visit: Payer: Self-pay | Admitting: Nurse Practitioner

## 2023-01-14 DIAGNOSIS — Z794 Long term (current) use of insulin: Secondary | ICD-10-CM

## 2023-01-14 MED ORDER — OZEMPIC (0.25 OR 0.5 MG/DOSE) 2 MG/3ML ~~LOC~~ SOPN
0.2500 mg | PEN_INJECTOR | SUBCUTANEOUS | 1 refills | Status: DC
Start: 2023-01-14 — End: 2023-03-13

## 2023-02-04 LAB — COLOGUARD: COLOGUARD: NEGATIVE

## 2023-02-05 ENCOUNTER — Telehealth: Payer: Self-pay | Admitting: Nurse Practitioner

## 2023-02-05 NOTE — Telephone Encounter (Signed)
Patient dropped off document Insulin Treated Diabetes Mellitus Assessment form, to be filled out by provider. Patient requested to send it back via Call Patient to pick up within 7-days. Document is located in providers tray at front office.Please advise at [facility] [phone]

## 2023-02-07 NOTE — Telephone Encounter (Signed)
Forms received

## 2023-02-14 ENCOUNTER — Telehealth: Payer: Self-pay | Admitting: Nurse Practitioner

## 2023-02-14 ENCOUNTER — Telehealth: Payer: 59 | Admitting: Nurse Practitioner

## 2023-02-14 DIAGNOSIS — E119 Type 2 diabetes mellitus without complications: Secondary | ICD-10-CM | POA: Diagnosis not present

## 2023-02-14 DIAGNOSIS — Z794 Long term (current) use of insulin: Secondary | ICD-10-CM | POA: Diagnosis not present

## 2023-02-14 MED ORDER — ROSUVASTATIN CALCIUM 5 MG PO TABS
5.0000 mg | ORAL_TABLET | Freq: Every day | ORAL | 1 refills | Status: AC
Start: 2023-02-14 — End: ?

## 2023-02-14 NOTE — Telephone Encounter (Signed)
Pt was schedule today at 3:20 pm virtual

## 2023-02-14 NOTE — Assessment & Plan Note (Signed)
Chronic Patient to continue on metformin 500 mg daily, Lantus 30 units daily, and Ozempic 0.25 mg weekly.  Follow-up as scheduled next month. Reduce rosuvastatin from 20 to 5 mg daily to see if this results in improvement in leg pain Plan on rechecking lipid panel at next office visit Forms completed and left at front desk for patient pickup

## 2023-02-14 NOTE — Telephone Encounter (Signed)
Please call patent and schedule him for an in-person visit as soon as possible. I will need to review his at-home blood glucose readings in order to complete his paperwork. Thank you.

## 2023-02-14 NOTE — Progress Notes (Signed)
   Established Patient Office Visit  An audio/visual tele-health visit was completed today for this patient. I connected with  Henry Ramsey on 02/14/23 utilizing audio/visual technology and verified that I am speaking with the correct person using two identifiers. The patient was located at their home, and I was located at the office of [facility] at [facility] during the encounter. I discussed the limitations of evaluation and management by telemedicine. The patient expressed understanding and agreed to proceed.  '  Subjective   Patient ID: Henry Ramsey, male    DOB: Oct 01, 1975  Age: 47 y.o. MRN: [id number]  Chief Complaint  Patient presents with   Diabetes    Patient arrives today for virtual visit regarding paperwork that he needs filled out for his employment.  He was prescribed, to her in August and reports has been checking his blood sugars daily majority of the time since then.  He checks it once a day in the morning, reports seeing blood sugar usually between 105-110s.  Denies any hypoglycemic events.  Last diabetic eye exam was in November 2023, reports no significant abnormalities were noted by ophthalmologist.  No evidence of endorgan damage based on chart review.  He reports being compliant with treatment regimen.  He does report he had some leg pain and was seen in urgent care or the emergency department recently for this.  They recommended discussing rosuvastatin with me.  He currently is on 20 mg daily.    ROS: see HPI    Objective:     There were no vitals taken for this visit. BP Readings from Last 3 Encounters:  12/27/22 118/80  09/02/17 118/77  06/03/17 113/63   Wt Readings from Last 3 Encounters:  12/27/22 205 lb 9.6 oz (93.3 kg)  06/03/17 175 lb (79.4 kg)  10/17/16 175 lb (79.4 kg)      Physical Exam Comprehensive physical exam not completed today as office visit was conducted remotely.  Patient appears well over video.  Patient was alert and  oriented, and appeared to have appropriate judgment.   No results found for any visits on 02/14/23.    The ASCVD Risk score (Arnett DK, et al., 2019) failed to calculate for the following reasons:   The valid total cholesterol range is 130 to 320 mg/dL    Assessment & Plan:   Problem List Items Addressed This Visit       Endocrine   Type 2 diabetes mellitus without complication, with long-term current use of insulin (HCC) - Primary    Chronic Patient to continue on metformin 500 mg daily, Lantus 30 units daily, and Ozempic 0.25 mg weekly.  Follow-up as scheduled next month. Reduce rosuvastatin from 20 to 5 mg daily to see if this results in improvement in leg pain Plan on rechecking lipid panel at next office visit Forms completed and left at front desk for patient pickup      Relevant Medications   rosuvastatin (CRESTOR) 5 MG tablet    Return for As scheduled.    Henry Paddy, NP

## 2023-02-20 ENCOUNTER — Ambulatory Visit: Payer: 59 | Admitting: Nurse Practitioner

## 2023-02-21 ENCOUNTER — Telehealth: Payer: 59 | Admitting: Nurse Practitioner

## 2023-03-13 ENCOUNTER — Other Ambulatory Visit: Payer: Self-pay | Admitting: Nurse Practitioner

## 2023-03-13 DIAGNOSIS — E119 Type 2 diabetes mellitus without complications: Secondary | ICD-10-CM

## 2023-03-20 ENCOUNTER — Ambulatory Visit: Payer: Self-pay | Admitting: Nurse Practitioner

## 2023-03-21 ENCOUNTER — Ambulatory Visit (INDEPENDENT_AMBULATORY_CARE_PROVIDER_SITE_OTHER): Payer: Self-pay | Admitting: Nurse Practitioner

## 2023-03-21 VITALS — BP 114/80 | HR 77 | Temp 97.6°F | Ht 71.0 in | Wt 202.2 lb

## 2023-03-21 DIAGNOSIS — E119 Type 2 diabetes mellitus without complications: Secondary | ICD-10-CM

## 2023-03-21 DIAGNOSIS — Z794 Long term (current) use of insulin: Secondary | ICD-10-CM

## 2023-03-21 DIAGNOSIS — R5383 Other fatigue: Secondary | ICD-10-CM | POA: Insufficient documentation

## 2023-03-21 LAB — LIPID PANEL
Cholesterol: 135 mg/dL (ref 0–200)
HDL: 31.4 mg/dL — ABNORMAL LOW (ref >39.00)
LDL Cholesterol: 85 mg/dL (ref 0–99)
NonHDL: 103.18
Total CHOL/HDL Ratio: 4
Triglycerides: 90 mg/dL (ref 0.0–149.0)
VLDL: 18 mg/dL (ref 0.0–40.0)

## 2023-03-21 LAB — VITAMIN D 25 HYDROXY (VIT D DEFICIENCY, FRACTURES): VITD: 14.68 ng/mL — ABNORMAL LOW (ref 30.00–100.00)

## 2023-03-21 LAB — VITAMIN B12: Vitamin B-12: 150 pg/mL — ABNORMAL LOW (ref 211–911)

## 2023-03-21 MED ORDER — INSULIN PEN NEEDLE 31G X 5 MM MISC: 1.0000 | Freq: Every day | 0 refills | Status: DC

## 2023-03-21 NOTE — Assessment & Plan Note (Signed)
Per chart review thyroid screening normal, no anemia.  Will check B12 and vitamin D level today.  Likely fatigue is related to multiple factors possibly including stress that he is under currently.  Further recommendations may be made based upon lab results.

## 2023-03-21 NOTE — Progress Notes (Signed)
Established Patient Office Visit  Subjective   Patient ID: Henry Ramsey, male    DOB: July 21, 1975  Age: 47 y.o. MRN: [id number]  Chief Complaint  Patient presents with   Medical Management of Chronic Issues    Follow up on visit    Diabetes    T2DM/HLD: Last A1c 7.5, patient had been restarted on Ozempic since last office visit.  Also continues on Lantus and metformin.  Tolerating medications well.  Rosuvastatin reduced from 20 to 5 mg daily due to myalgias.  Reports no longer experiencing any pain. Fatigue: States that he is quite tired lately.  Reports that he is going through a divorce, mood is maybe a bit depressed but he is in counseling and does not want to discuss pharmacological therapy at this time.  No suicidal ideation.    Review of Systems  Eyes:  Negative for blurred vision.  Respiratory:  Negative for shortness of breath.   Cardiovascular:  Negative for chest pain.  Neurological:  Negative for headaches.      Objective:     BP 114/80   Pulse 77   Temp 97.6 F (36.4 C) (Temporal)   Ht 5\' 11"  (1.803 m)   Wt 202 lb 4 oz (91.7 kg)   SpO2 93%   BMI 28.21 kg/m        No data to display           Physical Exam Vitals reviewed.  Constitutional:      Appearance: Normal appearance.  HENT:     Head: Normocephalic and atraumatic.  Cardiovascular:     Rate and Rhythm: Normal rate and regular rhythm.  Pulmonary:     Effort: Pulmonary effort is normal.     Breath sounds: Normal breath sounds.  Musculoskeletal:     Cervical back: Neck supple.  Skin:    General: Skin is warm and dry.  Neurological:     Mental Status: He is alert and oriented to person, place, and time.  Psychiatric:        Mood and Affect: Mood normal.        Behavior: Behavior normal.        Thought Content: Thought content normal.        Judgment: Judgment normal.      No results found for any visits on 03/21/23.    The ASCVD Risk score (Arnett DK, et al., 2019) failed to  calculate for the following reasons:   The valid total cholesterol range is 130 to 320 mg/dL    Assessment & Plan:   Problem List Items Addressed This Visit       Endocrine   Type 2 diabetes mellitus without complication, with long-term current use of insulin (HCC)    Chronic Last A1c above goal, but he has restarted Ozempic.  Reports fasting blood sugars in the low 100s. For now continue on metformin 500 mg twice a day, Lantus 30 units daily, and Ozempic 0.25 mg weekly. Follow-up in 2 months at which point we will recheck A1c. Also continue rosuvastatin 5 mg daily.  Will check fasting lipid panel today.      Relevant Medications   Insulin Pen Needle 31G X 5 MM MISC   Other Relevant Orders   Lipid panel     Other   Fatigue - Primary    Per chart review thyroid screening normal, no anemia.  Will check B12 and vitamin D level today.  Likely fatigue is related to multiple factors possibly  including stress that he is under currently.  Further recommendations may be made based upon lab results.      Relevant Orders   Vitamin B12   VITAMIN D 25 Hydroxy (Vit-D Deficiency, Fractures)    Return in about 3 months (around 06/21/2023) for 2-3 months CPE with Maralyn Sago.    Elenore Paddy, NP

## 2023-03-21 NOTE — Assessment & Plan Note (Signed)
Chronic Last A1c above goal, but he has restarted Ozempic.  Reports fasting blood sugars in the low 100s. For now continue on metformin 500 mg twice a day, Lantus 30 units daily, and Ozempic 0.25 mg weekly. Follow-up in 2 months at which point we will recheck A1c. Also continue rosuvastatin 5 mg daily.  Will check fasting lipid panel today.

## 2023-05-21 ENCOUNTER — Telehealth: Payer: Self-pay | Admitting: Nurse Practitioner

## 2023-05-21 NOTE — Telephone Encounter (Signed)
 Patient dropped off form for Adella Agee to fill out and fax.  Please call patient when this has been completed and faxed

## 2023-05-23 ENCOUNTER — Telehealth: Payer: Self-pay | Admitting: Pharmacist

## 2023-05-23 ENCOUNTER — Other Ambulatory Visit (HOSPITAL_BASED_OUTPATIENT_CLINIC_OR_DEPARTMENT_OTHER): Payer: Self-pay

## 2023-05-23 ENCOUNTER — Other Ambulatory Visit: Payer: Self-pay | Admitting: Nurse Practitioner

## 2023-05-23 ENCOUNTER — Other Ambulatory Visit (HOSPITAL_COMMUNITY): Payer: Self-pay

## 2023-05-23 DIAGNOSIS — Z794 Long term (current) use of insulin: Secondary | ICD-10-CM

## 2023-05-23 DIAGNOSIS — E119 Type 2 diabetes mellitus without complications: Secondary | ICD-10-CM

## 2023-05-23 MED ORDER — INSULIN GLARGINE 100 UNIT/ML ~~LOC~~ SOLN
30.0000 [IU] | Freq: Every day | SUBCUTANEOUS | 1 refills | Status: DC
  Filled 2023-05-23: qty 10, 30d supply, fill #0

## 2023-05-23 MED ORDER — OZEMPIC (0.25 OR 0.5 MG/DOSE) 2 MG/3ML ~~LOC~~ SOPN
0.2500 mg | PEN_INJECTOR | SUBCUTANEOUS | 0 refills | Status: AC
  Filled 2023-05-23 – 2023-05-28 (×2): qty 3, 28d supply, fill #0

## 2023-05-23 MED ORDER — METFORMIN HCL 500 MG PO TABS
500.0000 mg | ORAL_TABLET | Freq: Two times a day (BID) | ORAL | 2 refills | Status: AC
  Filled 2023-05-23: qty 60, 30d supply, fill #0

## 2023-05-23 NOTE — Telephone Encounter (Signed)
Pharmacy Patient Advocate Encounter   Received notification from CoverMyMeds that prior authorization for Ozempic (0.25 or 0.5 MG/DOSE) 2MG /3ML pen-injectors is required/requested.   Insurance verification completed.   The patient is insured through [facility] .   Per test claim: PA required; PA submitted to above mentioned insurance via CoverMyMeds Key/confirmation #/EOC [id number] Status is pending

## 2023-05-23 NOTE — Telephone Encounter (Signed)
Copied from CRM [id number]. Topic: Clinical - Medication Refill >> May 23, 2023 12:11 PM Deaijah H wrote: Most Recent Primary Care Visit:  Provider: Elenore Paddy  Department: [facility]  Visit Type: OFFICE VISIT  Date: 03/21/2023  Medication: metFORMIN (GLUCOPHAGE) 500 MG tablet /Semaglutide,0.25 or 0.5MG /DOS, (OZEMPIC, 0.25 OR 0.5 MG/DOSE,) 2 MG/3ML SOPN /   Has the patient contacted their pharmacy? Yes (Agent: If no, request that the patient contact the pharmacy for the refill. If patient does not wish to contact the pharmacy document the reason why and proceed with request.) (Agent: If yes, when and what did the pharmacy advise?)  Is this the correct pharmacy for this prescription? Yes If no, delete pharmacy and type the correct one.  This is the patient's preferred pharmacy:    [id number] - [place], [place] - [address] [place] [address] Phone: [phone] Fax: [phone]   Has the prescription been filled recently? No  Is the patient out of the medication? No  Has the patient been seen for an appointment in the last year OR does the patient have an upcoming appointment? Yes  Can we respond through MyChart? Yes  Agent: Please be advised that Rx refills may take up to 3 business days. We ask that you follow-up with your pharmacy.

## 2023-05-26 ENCOUNTER — Other Ambulatory Visit (HOSPITAL_BASED_OUTPATIENT_CLINIC_OR_DEPARTMENT_OTHER): Payer: Self-pay

## 2023-05-26 NOTE — Telephone Encounter (Signed)
Copied from CRM [id number]. Topic: General - Other >> May 26, 2023  9:55 AM Elizebeth Brooking wrote: Reason for CRM: Patient called in wanting to speak with Delorise Shiner had a missed call, stating she had a couple of questions for him. Patient is requesting a callback from her at [phone]

## 2023-05-26 NOTE — Telephone Encounter (Signed)
Forms in the process of being complete

## 2023-05-27 ENCOUNTER — Other Ambulatory Visit (HOSPITAL_BASED_OUTPATIENT_CLINIC_OR_DEPARTMENT_OTHER): Payer: Self-pay

## 2023-05-27 ENCOUNTER — Telehealth: Payer: Self-pay | Admitting: Pharmacy Technician

## 2023-05-27 ENCOUNTER — Other Ambulatory Visit (HOSPITAL_COMMUNITY): Payer: Self-pay

## 2023-05-27 NOTE — Telephone Encounter (Signed)
Pharmacy Patient Advocate Encounter  Received notification from [employer]  that Prior Authorization for Ozempic (0.25 or 0.5 MG/DOSE) 2MG /3ML pen-injectors  has been APPROVED from 05/27/23 to 05/26/24. Unable to obtain price due to refill too soon rejection, last fill date 05/27/23 next available fill date02/12/25

## 2023-05-27 NOTE — Telephone Encounter (Signed)
Pharmacy Patient Advocate Encounter  Received notification from [facility] that Prior Authorization for Ozempic (0.25 or 0.5 MG/DOSE) 2MG /3ML pen-injectors  has been DENIED.  Full denial letter will be uploaded to the media tab. See denial reason below.       **Second PA submitted with patients primary insurance. Please see new encounter created on 05/27/23.**

## 2023-05-27 NOTE — Telephone Encounter (Signed)
Copied from CRM [id number]. Topic: General - Other >> May 27, 2023  3:18 PM Elizebeth Brooking wrote: Reason for CRM: Patient called in regarding his medical card forms, is requesting a callback regarding the status of if the forms have been signed

## 2023-05-27 NOTE — Telephone Encounter (Signed)
Pharmacy Patient Advocate Encounter   Received notification from CoverMyMeds that prior authorization for Ozempic (0.25 or 0.5 MG/DOSE) 2MG /3ML pen-injectors is required/requested.   Insurance verification completed.   The patient is insured through  [employer]  .   Per test claim: PA required; PA submitted to above mentioned insurance via CoverMyMeds Key/confirmation #/EOC [facility] Status is pending

## 2023-05-28 ENCOUNTER — Other Ambulatory Visit: Payer: Self-pay | Admitting: Nurse Practitioner

## 2023-05-28 ENCOUNTER — Other Ambulatory Visit (HOSPITAL_BASED_OUTPATIENT_CLINIC_OR_DEPARTMENT_OTHER): Payer: Self-pay

## 2023-05-28 DIAGNOSIS — Z794 Long term (current) use of insulin: Secondary | ICD-10-CM

## 2023-05-28 MED ORDER — INSULIN GLARGINE 100 UNITS/ML SOLOSTAR PEN
30.0000 [IU] | PEN_INJECTOR | Freq: Every day | SUBCUTANEOUS | 6 refills | Status: AC
  Filled 2023-05-28: qty 9, 30d supply, fill #0

## 2023-05-29 ENCOUNTER — Other Ambulatory Visit (HOSPITAL_BASED_OUTPATIENT_CLINIC_OR_DEPARTMENT_OTHER): Payer: Self-pay

## 2023-05-29 MED ORDER — INSULIN PEN NEEDLE 31G X 5 MM MISC: 1.0000 | Freq: Every day | 0 refills | Status: DC

## 2023-05-29 NOTE — Telephone Encounter (Signed)
Forms completed and called pt to let them know it ready to pick up

## 2023-05-30 ENCOUNTER — Other Ambulatory Visit (HOSPITAL_BASED_OUTPATIENT_CLINIC_OR_DEPARTMENT_OTHER): Payer: Self-pay

## 2023-06-03 ENCOUNTER — Other Ambulatory Visit (HOSPITAL_BASED_OUTPATIENT_CLINIC_OR_DEPARTMENT_OTHER): Payer: Self-pay

## 2023-06-03 ENCOUNTER — Other Ambulatory Visit (HOSPITAL_COMMUNITY): Payer: Self-pay

## 2023-06-05 ENCOUNTER — Other Ambulatory Visit (HOSPITAL_BASED_OUTPATIENT_CLINIC_OR_DEPARTMENT_OTHER): Payer: Self-pay

## 2023-06-06 ENCOUNTER — Other Ambulatory Visit (HOSPITAL_BASED_OUTPATIENT_CLINIC_OR_DEPARTMENT_OTHER): Payer: Self-pay

## 2023-06-09 ENCOUNTER — Other Ambulatory Visit (HOSPITAL_BASED_OUTPATIENT_CLINIC_OR_DEPARTMENT_OTHER): Payer: Self-pay

## 2023-06-11 ENCOUNTER — Other Ambulatory Visit (HOSPITAL_BASED_OUTPATIENT_CLINIC_OR_DEPARTMENT_OTHER): Payer: Self-pay

## 2023-06-12 ENCOUNTER — Other Ambulatory Visit (HOSPITAL_BASED_OUTPATIENT_CLINIC_OR_DEPARTMENT_OTHER): Payer: Self-pay

## 2023-06-13 ENCOUNTER — Other Ambulatory Visit (HOSPITAL_BASED_OUTPATIENT_CLINIC_OR_DEPARTMENT_OTHER): Payer: Self-pay

## 2023-06-16 ENCOUNTER — Other Ambulatory Visit (HOSPITAL_BASED_OUTPATIENT_CLINIC_OR_DEPARTMENT_OTHER): Payer: Self-pay

## 2023-06-17 ENCOUNTER — Other Ambulatory Visit (HOSPITAL_BASED_OUTPATIENT_CLINIC_OR_DEPARTMENT_OTHER): Payer: Self-pay

## 2023-06-18 ENCOUNTER — Other Ambulatory Visit (HOSPITAL_BASED_OUTPATIENT_CLINIC_OR_DEPARTMENT_OTHER): Payer: Self-pay

## 2023-06-19 ENCOUNTER — Other Ambulatory Visit (HOSPITAL_BASED_OUTPATIENT_CLINIC_OR_DEPARTMENT_OTHER): Payer: Self-pay

## 2023-06-20 ENCOUNTER — Other Ambulatory Visit (HOSPITAL_BASED_OUTPATIENT_CLINIC_OR_DEPARTMENT_OTHER): Payer: Self-pay

## 2023-06-23 ENCOUNTER — Other Ambulatory Visit (HOSPITAL_BASED_OUTPATIENT_CLINIC_OR_DEPARTMENT_OTHER): Payer: Self-pay

## 2023-06-24 ENCOUNTER — Other Ambulatory Visit (HOSPITAL_BASED_OUTPATIENT_CLINIC_OR_DEPARTMENT_OTHER): Payer: Self-pay

## 2023-06-25 ENCOUNTER — Other Ambulatory Visit (HOSPITAL_BASED_OUTPATIENT_CLINIC_OR_DEPARTMENT_OTHER): Payer: Self-pay

## 2023-07-30 ENCOUNTER — Other Ambulatory Visit: Payer: Self-pay | Admitting: Nurse Practitioner

## 2023-07-30 DIAGNOSIS — Z794 Long term (current) use of insulin: Secondary | ICD-10-CM

## 2023-07-30 DIAGNOSIS — E119 Type 2 diabetes mellitus without complications: Secondary | ICD-10-CM
